# Patient Record
Sex: Male | Born: 1960 | Race: White | Hispanic: No | Marital: Married | Smoking: Never smoker
Health system: Southern US, Community
[De-identification: ages and names within clinical notes are randomized; demographics above are authoritative.]

## PROBLEM LIST (undated history)

## (undated) DIAGNOSIS — K219 Gastro-esophageal reflux disease without esophagitis: Secondary | ICD-10-CM

## (undated) DIAGNOSIS — E785 Hyperlipidemia, unspecified: Secondary | ICD-10-CM

## (undated) DIAGNOSIS — T7840XA Allergy, unspecified, initial encounter: Secondary | ICD-10-CM

## (undated) DIAGNOSIS — J45909 Unspecified asthma, uncomplicated: Secondary | ICD-10-CM

## (undated) DIAGNOSIS — B019 Varicella without complication: Secondary | ICD-10-CM

## (undated) HISTORY — DX: Gastro-esophageal reflux disease without esophagitis: K21.9

## (undated) HISTORY — PX: BRONCHOSCOPY: SUR163

## (undated) HISTORY — DX: Unspecified asthma, uncomplicated: J45.909

## (undated) HISTORY — DX: Hyperlipidemia, unspecified: E78.5

## (undated) HISTORY — DX: Varicella without complication: B01.9

## (undated) HISTORY — DX: Allergy, unspecified, initial encounter: T78.40XA

---

## 2004-12-05 ENCOUNTER — Encounter: Admission: RE | Admit: 2004-12-05 | Discharge: 2005-01-14 | Payer: Self-pay | Admitting: Nurse Practitioner

## 2005-10-02 ENCOUNTER — Emergency Department (HOSPITAL_COMMUNITY): Admission: EM | Admit: 2005-10-02 | Discharge: 2005-10-02 | Payer: Self-pay | Admitting: Emergency Medicine

## 2006-09-10 ENCOUNTER — Inpatient Hospital Stay (HOSPITAL_BASED_OUTPATIENT_CLINIC_OR_DEPARTMENT_OTHER): Admission: RE | Admit: 2006-09-10 | Discharge: 2006-09-10 | Payer: Self-pay | Admitting: Cardiology

## 2007-09-13 ENCOUNTER — Emergency Department (HOSPITAL_BASED_OUTPATIENT_CLINIC_OR_DEPARTMENT_OTHER): Admission: EM | Admit: 2007-09-13 | Discharge: 2007-09-13 | Payer: Self-pay | Admitting: Emergency Medicine

## 2007-09-30 ENCOUNTER — Ambulatory Visit: Payer: Self-pay | Admitting: Emergency Medicine

## 2007-09-30 ENCOUNTER — Emergency Department (HOSPITAL_BASED_OUTPATIENT_CLINIC_OR_DEPARTMENT_OTHER): Admission: EM | Admit: 2007-09-30 | Discharge: 2007-09-30 | Payer: Self-pay | Admitting: Emergency Medicine

## 2007-09-30 ENCOUNTER — Other Ambulatory Visit: Payer: Self-pay | Admitting: Emergency Medicine

## 2007-09-30 ENCOUNTER — Observation Stay (HOSPITAL_COMMUNITY): Admission: AD | Admit: 2007-09-30 | Discharge: 2007-10-01 | Payer: Self-pay | Admitting: Internal Medicine

## 2007-10-01 ENCOUNTER — Encounter: Payer: Self-pay | Admitting: Internal Medicine

## 2007-10-02 ENCOUNTER — Ambulatory Visit: Payer: Self-pay | Admitting: Internal Medicine

## 2007-10-02 DIAGNOSIS — E785 Hyperlipidemia, unspecified: Secondary | ICD-10-CM

## 2007-10-02 DIAGNOSIS — J45909 Unspecified asthma, uncomplicated: Secondary | ICD-10-CM | POA: Insufficient documentation

## 2007-10-02 DIAGNOSIS — J984 Other disorders of lung: Secondary | ICD-10-CM | POA: Insufficient documentation

## 2007-10-03 ENCOUNTER — Telehealth (INDEPENDENT_AMBULATORY_CARE_PROVIDER_SITE_OTHER): Payer: Self-pay | Admitting: *Deleted

## 2007-10-06 ENCOUNTER — Encounter: Payer: Self-pay | Admitting: Internal Medicine

## 2007-10-06 ENCOUNTER — Ambulatory Visit: Payer: Self-pay | Admitting: Internal Medicine

## 2007-10-06 ENCOUNTER — Ambulatory Visit: Admission: RE | Admit: 2007-10-06 | Discharge: 2007-10-06 | Payer: Self-pay | Admitting: Internal Medicine

## 2007-10-07 ENCOUNTER — Telehealth (INDEPENDENT_AMBULATORY_CARE_PROVIDER_SITE_OTHER): Payer: Self-pay | Admitting: *Deleted

## 2007-10-07 ENCOUNTER — Telehealth: Payer: Self-pay | Admitting: Pulmonary Disease

## 2007-10-07 ENCOUNTER — Telehealth: Payer: Self-pay | Admitting: Critical Care Medicine

## 2007-10-07 ENCOUNTER — Encounter: Payer: Self-pay | Admitting: Critical Care Medicine

## 2007-10-08 ENCOUNTER — Telehealth: Payer: Self-pay | Admitting: Internal Medicine

## 2007-10-08 DIAGNOSIS — J383 Other diseases of vocal cords: Secondary | ICD-10-CM | POA: Insufficient documentation

## 2007-10-13 ENCOUNTER — Emergency Department (HOSPITAL_BASED_OUTPATIENT_CLINIC_OR_DEPARTMENT_OTHER): Admission: EM | Admit: 2007-10-13 | Discharge: 2007-10-13 | Payer: Self-pay | Admitting: Emergency Medicine

## 2007-10-22 ENCOUNTER — Ambulatory Visit: Payer: Self-pay | Admitting: Internal Medicine

## 2007-10-22 DIAGNOSIS — J329 Chronic sinusitis, unspecified: Secondary | ICD-10-CM | POA: Insufficient documentation

## 2007-10-25 ENCOUNTER — Telehealth: Payer: Self-pay | Admitting: Internal Medicine

## 2007-10-28 ENCOUNTER — Encounter: Payer: Self-pay | Admitting: Internal Medicine

## 2007-10-28 ENCOUNTER — Ambulatory Visit (HOSPITAL_COMMUNITY): Admission: RE | Admit: 2007-10-28 | Discharge: 2007-10-28 | Payer: Self-pay | Admitting: Internal Medicine

## 2007-10-30 ENCOUNTER — Ambulatory Visit: Payer: Self-pay | Admitting: Internal Medicine

## 2007-10-30 ENCOUNTER — Encounter: Admission: RE | Admit: 2007-10-30 | Discharge: 2007-11-27 | Payer: Self-pay | Admitting: Internal Medicine

## 2007-11-13 ENCOUNTER — Encounter: Payer: Self-pay | Admitting: Pulmonary Disease

## 2007-11-13 ENCOUNTER — Encounter: Payer: Self-pay | Admitting: Internal Medicine

## 2007-11-13 ENCOUNTER — Ambulatory Visit (HOSPITAL_BASED_OUTPATIENT_CLINIC_OR_DEPARTMENT_OTHER): Admission: RE | Admit: 2007-11-13 | Discharge: 2007-11-13 | Payer: Self-pay | Admitting: Internal Medicine

## 2007-11-20 ENCOUNTER — Ambulatory Visit: Payer: Self-pay | Admitting: Pulmonary Disease

## 2007-11-21 ENCOUNTER — Ambulatory Visit: Payer: Self-pay | Admitting: Internal Medicine

## 2007-11-27 ENCOUNTER — Encounter: Payer: Self-pay | Admitting: Internal Medicine

## 2007-11-28 ENCOUNTER — Ambulatory Visit: Payer: Self-pay | Admitting: Pulmonary Disease

## 2007-11-28 DIAGNOSIS — G4733 Obstructive sleep apnea (adult) (pediatric): Secondary | ICD-10-CM | POA: Insufficient documentation

## 2008-01-26 ENCOUNTER — Encounter: Payer: Self-pay | Admitting: Pulmonary Disease

## 2008-01-28 ENCOUNTER — Telehealth (INDEPENDENT_AMBULATORY_CARE_PROVIDER_SITE_OTHER): Payer: Self-pay | Admitting: *Deleted

## 2008-01-30 ENCOUNTER — Telehealth (INDEPENDENT_AMBULATORY_CARE_PROVIDER_SITE_OTHER): Payer: Self-pay | Admitting: *Deleted

## 2008-02-05 ENCOUNTER — Encounter: Payer: Self-pay | Admitting: Pulmonary Disease

## 2008-02-18 ENCOUNTER — Encounter: Payer: Self-pay | Admitting: Pulmonary Disease

## 2008-03-08 ENCOUNTER — Ambulatory Visit: Payer: Self-pay | Admitting: Pulmonary Disease

## 2008-03-12 ENCOUNTER — Encounter: Payer: Self-pay | Admitting: Pulmonary Disease

## 2008-06-01 ENCOUNTER — Ambulatory Visit (HOSPITAL_COMMUNITY): Admission: RE | Admit: 2008-06-01 | Discharge: 2008-06-01 | Payer: Self-pay | Admitting: Family Medicine

## 2010-02-05 ENCOUNTER — Encounter: Payer: Self-pay | Admitting: Internal Medicine

## 2010-05-30 NOTE — Op Note (Signed)
Justin Richard, Justin Richard               ACCOUNT NO.:  [id number]   MEDICAL RECORD NO.:  [id number]          PATIENT TYPE:  AMB   LOCATION:  CARD                         FACILITY:  [facility]   PHYSICIAN:  Kalman Shan, MD   DATE OF BIRTH:  May 31, 1960   DATE OF PROCEDURE:  10/06/2007  DATE OF DISCHARGE:                               OPERATIVE REPORT   TYPE OF PROCEDURE:  Bronchoscopy with airway exam, rule out  endobronchial lesion.   INDICATION:  Respiratory symptoms of shortness of breath and wheezing  with focal wheezing, normal pulmonary function testing.   PREPROCEDURE ASSESSMENT:  The history and physical was less than 30 days  old.  I had seen him in the office on October 02, 2007.  The interim  history is that this morning just prior to bronchoscopy.  He did have  some mild streaky hemoptysis that is new.   PREPROCEDURE VITAL SIGNS:  Height 73 inches, weight 250 pounds, blood  pressure 134/87, pulse of 62, respiratory rate of 13.  ASA class I.   AIRWAY ASSESSMENT:  Mallampati class III.   Physical exam was within normal limits other than some obesity.   The patient was considered acceptable for IV moderate sedation and could  undergo procedure.   PREPROCEDURE SEDATION PLAN:  Topical anesthesia with lidocaine, maximum  dose being 904 mg, an intermittent doses of fentanyl and Versed for  sedation with a goal sedation of RASS -1 to -2.   PROCEDURE:  After adequate premedication, the scope was introduced  through the left naris at 12:23.  The vocal cords were visualized and in  the pharyngeal area just anterior to the epiglottis and supraglottic  area there was plenty of white plaque-like lesions consistent with  thrush.  A photograph was taken.  Subsequently the vocal cord movements  were visualized in close detail.  It appeared that they were moving  normally with inspiration and expiration. However, when instructed to  take a deep breath: at the height of inspiration,  there was vocal cord  adduction multiple times.  This was consistent with vocal cord  dysfunction.   Subsequently, after adequate topical anesthesia of the vocal cords, the  bronchoscope was negotiated through the trachea.  In the mid tracheal  region in the anterior 1 o'clock position a couple of white plaque-like  lesions were seen consistent with thrush. Photographs of this was taken  as well.  Subsequently a detailed airway exam on the right side was  first performed.  The right bronchus intermedius appeared hyperdynamic  and extrinsically collapsible.  No endobronchial lesions were seen.  White mucus plug was seen deep in the right lower lobe posterior  segment.  This was aspirated without any problems.  Careful exam in the  different subsegments of the different right sided lobes did not reveal  in any endobronchial lesion.   The scope was then withdrawn and attention turned to the left side.  In  the carina and the left main bronchus medially there was white plaque-  like lesions and in addition what looked like a mucus plug.  This was  aspirated easily.  However, following the aspiration there was easy  bruising of the left main bronchus with clouding of the visual field by  blood.  At this time, the patient began to have a lot of cough.  Subsequently more sedation was given.  He then had transient  desaturation to 77% pulse ox.  A bite block was placed and the FIO2 was  increased.  With this, the desaturations settled.  After more topical  lidocaine further visualization of the left-sided airways was done.  Again was extrinsic compression and hyperdynamic compression of the left  main bronchus.  Distal to this area easy bruising in the left main  bronchus.  The rest of the subsegments and all the lobes looked normal.  No endobronchial lesions were seen and there is no evidence of any  abrasions of the trachea anywhere.   1. Folllowing this biopsies were taken of the secondary  carina leading      into the Right Upper Lobe. Some more biopises were taken from the      left main bronchus in the subcarinal regions in the area of where      the mucus plug was.  These were labeled together as biopsies from      the bronchus intermedius and left main bronchus.   Next set of biopsies were taken from the trachea but however, the biopsy  samples these were not good.  The intention was to biopsy the area of  the white plaque. Therefore, a bronchial brush was introduced and the  white lesions in the trachea at 1 o'clock position and the abraded part  of the left main bronchus was brushed and this was collectively labeled  and is being sent for microbiology.  Next a general collection of  bronchial washing which included all the original mucus plugs was  collected and is being sent for cytology, Gram stain and microbiology.   The scope was first introduced at 12:23, withdrawn at 12:43 for cleaning  and due to transient desaturations, reintroduced at 12:46 and then  finally withdrawn at 1304 hours.   POSTPROCEDURE IMPRESSION:  1. Supraglottic thrush.  2. Tracheal thrush  3. Possible thrush and mucus plugging in the left main bronchus.  4. Post aspiration of the left main bronchus secretions abrasion.  5. No endobronchial lesions seen.  6. Biopsies and brushes and washing collected.  7. Vocal cord dysfunction.   POSTPROCEDURE PLAN:  1. He will recover in the recovery room.  2. Two weeks of fluconazole will be prescribed.  3. Follow-up in the office to see how his symptoms improve with      fluconazole.  4. If symptoms still persist, will do bronchoscopy again.  He might      also need a surveillance bronchoscopy.   I have updated his wife about the findings and the plan.  A photograph  has been given to him and his wife.      Kalman Shan, MD  Electronically Signed     MR/MEDQ  D:  10/06/2007  T:  10/07/2007  Job:  [id number]

## 2010-05-30 NOTE — Cardiovascular Report (Signed)
NAME:  Justin Richard, Justin Richard NO.:  [id number]   MEDICAL RECORD NO.:  [id number]          PATIENT TYPE:  OIB   LOCATION:  NA                           FACILITY:  [facility]   PHYSICIAN:  Peter M. Swaziland, M.D.  DATE OF BIRTH:  [birth year]   DATE OF PROCEDURE:  09/10/2006  DATE OF DISCHARGE:                            CARDIAC CATHETERIZATION   INDICATION FOR PROCEDURE:  This 50 year old white male was recently  hospitalized overnight for evaluation of chest pain.  He ruled out for  myocardial infarction.  However, on subsequent stress Cardiolite study,  there was noted to be a moderate reversible defect in the anterolateral  wall.  The patient has been asymptomatic since then.  He does have a  history of dyslipidemia.  He also has a family history of coronary  disease.   PROCEDURES:  1. Left heart catheterization.  2. Coronary and left ventricular angiography.   ACCESS:  Is via the right femoral artery using the standard Seldinger  technique.   EQUIPMENT:  4-French 4 cm left Judkins catheter, 4-French 3DRC catheter,  4-French pigtail catheter, 4-French arterial sheath.   CONTRAST:  85 mL of Omnipaque.   MEDICATIONS:  Local anesthesia with 1% Xylocaine.   HEMODYNAMIC DATA:  Aortic pressure was 119/75 with a mean of 95 mmHg.  Left ventricle pressure was 114 with EDP of 14 mmHg.   ANGIOGRAPHIC DATA:  The left coronary arises and distributes normally.  The left main coronary artery is normal.   The left anterior descending is a large vessel and is normal throughout  its course.  There is a moderate-size first diagonal branch which is  also normal.   The left circumflex coronary gives rise to three marginal vessels.  This  vessel was normal throughout its course.   The right coronary is a dominant vessel.  It arises normally.  It is  also normal.   Left ventricular angiography was performed in the RAO view.  This  demonstrates normal left ventricular size and  contractility with normal  systolic function.  Ejection fraction is estimated 60%.  There is no  significant mitral regurgitation or prolapse.   FINAL INTERPRETATION:  1. Normal coronary anatomy.  2. Normal left ventricular function.           ______________________________  Peter M. Swaziland, M.D.     PMJ/MEDQ  D:  09/10/2006  T:  09/10/2006  Job:  [id number]   cc:   Molly Maduro L. Foy Guadalajara, M.D.

## 2010-05-30 NOTE — Discharge Summary (Signed)
NAME:  Justin Richard, Justin Richard NO.:  [id number]   MEDICAL RECORD NO.:  [id number]          PATIENT TYPE:  OBV   LOCATION:  5037                         FACILITY:  [facility]   PHYSICIAN:  Ramiro Harvest, MD    DATE OF BIRTH:  February 12, 1960   DATE OF ADMISSION:  09/30/2007  DATE OF DISCHARGE:  10/01/2007                               DISCHARGE SUMMARY   PRIMARY CARE PHYSICIAN:  Robert L. Foy Guadalajara, M.D. of [facility].   DISCHARGE DIAGNOSES:  1. Mild asthma in the setting of upper respiratory infection and      gastroesophageal reflux disease.  2. Hyperlipidemia.   DISCHARGE MEDICATIONS:  1. Omeprazole 20 mg p.o. b.i.d.  2. Albuterol MDI 2 puffs every 4 hours p.r.n.   DISPOSITION AND FOLLOWUP:  The patient will be discharged home.  The  patient is to follow up with Dr. Marchelle Gearing as scheduled, and also to  follow up on his pulmonary function tests.  The patient is also to follow up with his primary care physician, Dr.  Foy Guadalajara, in 2 weeks.  The patient will need a followup CT on nodules in  the right middle and left upper lobes in 6-12 months.   PROCEDURE CONSULTATIONS DONE:  A pulmonary consult was done.  The  patient was seen in consultation by Dr. Delton Coombes of [facility] on  September 30, 2007.   PROCEDURES PERFORMED:  1. A pulmonary function test was performed on October 01, 2007.  2. A chest x-ray was performed on September 30, 2007, which showed      normal exam.  3. A CT of the chest without contrast was performed on September 30, 2007, which showed small nodules in the right middle and left upper      lobes.  Recommend followup CT in 6-12 months.  No active pulmonary      infiltrates, no interstitial or airspace disease.   BRIEF ADMISSION HISTORY AND PHYSICAL:  Justin Richard is a 50-year-  old gentleman,who for the past 6 weeks prior to admission,had had  problems with wheezing, which were progressively getting worse with some  shortness of breath.   The patient had been evaluated and followed by his  PCP with attempted inhalers.  The patient stated he tried his steroid  Dosepak, and he states that his symptoms have gotten worse.  The patient  had also been treated for reflux even though he did not have any other  reflux symptoms, but states again his symptoms continued to worsen.  The  patient was scheduled to see [facility], but then  started to have shortness of breath and wheezing.  In the early morning  hours of September 30, 2007, the patient went to the Medical Center at  [facility], at which time he was evaluated, given some neb treatments  and a dose of steroids and sent home after which he was feeling better.  However,  he returned 4 hours later complained of increased wheezing and  shortness of breath and was admitted.  At the time it was felt because  he had gotten worse, he needed to be admitted for further evaluation and  treatment.  The patient was given nebulizer treatment in the emergency  room, a dose of steroids and a transfer to [facility].  At the  time of admission the patient was feeling a little bit better.  The  patient denied any headaches, no visual changes, no dysphagia.  No  current chest pain, palpitations, no complaints of shortness of breath  at rest.  He complained of some mild wheezing, which improved after  breathing treatments.  No cough, no abdominal pain, no hematuria, no  dysuria, no constipation or diarrhea.  No focal extremity weakness,  numbness or pain.   REVIEW OF SYSTEMS:  Otherwise negative.   PHYSICAL EXAM:  Per admitting physician:  Temperature 98.6, pulse 70,  blood pressure 154/82, respirations 18, satting 94% on 2 liters nasal  cannula.  GENERAL:  The patient was alert and oriented x3, in no apparent  distress.  HEENT: Normocephalic, atraumatic.  Moist mucous membranes.  No carotid  bruits.  CARDIOVASCULAR:  Regular rate and rhythm.  S1, S2.  RESPIRATORY:   Upper apices were clear to auscultation bilaterally.  No  bases with some bilateral expiratory wheezing.  ABDOMEN:  Soft, nontender, nondistended.  Positive bowel sounds.  EXTREMITIES:  No clubbing, cyanosis or edema.   ADMISSION LABORATORIES:  Sodium 139, potassium 4.8, chloride 105, bicarb  22, BUN 29, creatinine 0.9, glucose of 118.  CBC with a white count of  10.6, no shift.  H and H 15 and 44, MCV of 87, platelet count of 244.  Chest x-ray at [facility] was completely normal.   HOSPITAL COURSE:  Mild asthma in the setting of gastroesophageal reflux  disease.  The patient was admitted with recurrent wheezing and shortness  of breath, which had been progressively getting worse over the past 6  weeks with the question of asthma.  The patient had no evidence of  chronic obstructive pulmonary disease, no history of smoking.  The  patient was placed on a nebulizer treatments as well as Protonix daily.  CT of his chest was obtained to rule out restrictive disease with  results as stated above.  An ABG was obtained during admission, which  came back with a pH of 7.42, pCO2 of 35, pO2 of 74, bicarb of 22, O2  saturation  of  95.  The patient was maintained on nebulizer treatments  throughout the hospitalization.  Pulmonary was consulted.  The patient  was seen in consultation by Dr. Delton Coombes of [facility].  Pulmonary  function studies were obtained and it was felt that the patient had a  mild component of asthma which improved with bronchodilator therapy.  It  was felt that the patient's exacerbation was exacerbated by am upper  respiratory tract infection.  The patient improved symptomatically  throughout the hospitalization and by day of discharge, the patient was  in stable and improved condition.  The patient was to continue on  omeprazole 20 mg b.i.d. as well as well as albuterol inhaler as needed,  and to follow up with Dr. Marchelle Gearing as scheduled for further  evaluation  and management.   Vital signs on day of discharge:  Temperature 97.8, pulse of 80, blood  pressure 142/74, respiratory rate 18, satting 98% on room air.  The rest  of the patient's chronic medical issues were stable throughout the  hospitalization and the patient will be discharged in stable and  improved  condition.     It was a pleasure taking care of Mr. Joash Tony.      Ramiro Harvest, MD  Electronically Signed     DT/MEDQ  D:  11/04/2007  T:  11/04/2007  Job:  [id number]   cc:   Molly Maduro L. Foy Guadalajara, M.D.  Kalman Shan, MD

## 2010-05-30 NOTE — Procedures (Signed)
NAME:  Justin Richard, Justin Richard NO.:  [id number]   MEDICAL RECORD NO.:  [id number]          PATIENT TYPE:  OUT   LOCATION:  SLEEP CENTER                 FACILITY[facility]  [facility]   PHYSICIAN:  Coralyn Helling, MD        DATE OF BIRTH:  03-Jan-1961   DATE OF STUDY:  11/13/2007                            NOCTURNAL POLYSOMNOGRAM   REFERRING PHYSICIAN:  Kalman Shan, MD   INDICATION FOR STUDY:  [name] is a 50 year old male who has a  history of snoring, sleep disruption, excessive daytime sleepiness, and  obesity.  He is referred to the sleep lab for evaluation of hypersomnia  with obstructive sleep apnea.   Height is 6 feet 1 inch.  Weight is 246 pounds.  BMI is 32.  Neck size  is 18.   EPWORTH SLEEPINESS SCORE:  Epworth score is 4.   MEDICATIONS:  TriCor, Singulair, QVAR , Foradil, Ventolin, Xopenex, and  omeprazole.   SLEEP ARCHITECTURE:  Total recording time was 410 minutes.  Total sleep  time was 323 minutes.  Sleep efficiency was 79%.  Sleep latency is 2.5  minutes.  REM latency is 199 minutes.  The study was notable for  reduction of the percentage of REM sleep.  The patient slept in both the  supine and nonsupine position.   RESPIRATORY DATA:  The average respiratory rate was 14.  Loud snoring  was noted by the technician throughout the study.  The overall apnea-  hypopnea index was 43.  The events were exclusively obstructive in  nature.  The REM apnea-hypopnea index was 32.  The non-REM apnea-  hypopnea index was 44.  The supine apnea-hypopnea index was 66.  The  nonsupine apnea-hypopnea index was 28.   OXYGEN DATA:  The baseline oxygenation was 98%.  The oxygen saturation  nadir was 82%.  The patient spent a total of 1.6 minutes with an oxygen  saturation less than 90% and 0.6 minutes with an oxygen saturation less  than 88%.   CARDIAC DATA:  The average heart rate was 63 and the rhythm strip showed  normal sinus rhythm.   MOVEMENT-PARASOMNIA:  The periodic  limb movement index was 0.  The  patient had 2 restroom trips.   IMPRESSION-RECOMMENDATIONS:  This study shows evidence for severe  obstructive sleep apnea with an apnea-hypopnea index of 43 and an oxygen  saturation nadir of 82%.  In addition to diet, exercise, and weight  reduction, the patient should be referred back to the Sleep Lab for a  CPAP titration study.      Coralyn Helling, MD  Diplomat, American Board of Sleep Medicine  Electronically Signed     VS/MEDQ  D:  11/20/2007 08:11:47  T:  11/20/2007 22:09:53  Job:  [id number]   cc:   Kalman Shan, MD  [address] [place] [address]  [place] [address]

## 2010-05-30 NOTE — H&P (Signed)
NAME:  ELMOR, KOST NO.:  [id number]   MEDICAL RECORD NO.:  [id number]           PATIENT TYPE:   LOCATION:                                 FACILITY:   PHYSICIAN:  Peter M. Swaziland, M.D.  DATE OF BIRTH:  1960-11-09   DATE OF PROCEDURE:  DATE OF DISCHARGE:                    STAT - MUST CHANGE TO CORRECT WORK TYPE   HISTORY OF PRESENT ILLNESS:  Mr. Mangan is a 50 year old white male  with history of hyperlipidemia and obesity.  He presented recently with  symptoms of substernal chest pain while on vacation at [facility].  He  described these symptoms as a pressure sensation in his mid chest with a  sensation of expanding like a balloon. His pain became fairly intense up  to 8/10. It lasts approximately 10 minutes and then seems to abate.  He  did present to the emergency department there and ruled out for  myocardial infarction with enzymes and ECG.  The following morning he  underwent a stress Cardiolite study.  He was able to exercise 9 minutes  on the Bruce protocol without any symptoms or chest pain.  He had no ECG  changes.  He had an adequate heart rate response.  Subsequent Cardiolite  images, however,  demonstrated a moderate defect involving the high  lateral wall that was worse on the stress images.  His ejection fraction  was 61%.  Based on these findings, further evaluation has been  recommended.  The patient has noted over several-month period symptoms  of increased dyspnea that may occur both with and without exertion.   PAST MEDICAL HISTORY:  1. Significant for dyslipidemia.  2. He has a history of reflux esophagitis.  3. History of obesity.  4. He has no history of hypertension or diabetes.  5. He has intolerance to codeine which causes nausea.   CURRENT MEDICATIONS:  1. Tricor 145 mg pre day.  2. Aspirin 81 mg per day.  3. Fish oil supplement 2000 mg per day.   SOCIAL HISTORY:  The patient works as a Product/process development scientist.  He is  married.   He has two children in good health.  He denies tobacco or  alcohol use.   FAMILY HISTORY:  Father died at age 30.  He had coronary disease and had  had two prior bypass surgeries. He had also had a stroke.  Mother died  at age 59 with Hodgkin's Disease.  One brother has some type of heart  disease.  He has a total of five siblings who are generally in good  health.  There is a family history of diabetes.   REVIEW OF SYSTEMS:  Otherwise unremarkable.   PHYSICAL EXAMINATION:   REVIEW OF SYSTEMS:  The patient denies any increased, edema, orthopnea.  She has had no tachy palpitations.  She denies any true chest pain.  No  history of CDA.  Other review of systems are negative.   PHYSICAL EXAMINATION:  GENERAL:  The patient is an obese white male in  no apparent distress.  VITAL SIGNS:  Weight 240, blood pressure 140/90 using the large cuff.  Pulse 72  and regular. Respirations are normal.  HEENT:  Normocephalic, atraumatic.  Pupils are equal, round and reactive  to light and accommodation.  Conjunctivae clear.  Oropharynx clear.  NECK:  Supple without JVD, adenopathy, thyromegaly, or bruits.  LUNGS:  Clear to auscultation and percussion.  CARDIAC:  Regular rate and rhythm without gallop, murmur or  click.  ABDOMEN:  Soft, nontender.  He has no masses or hepatosplenomegaly.  EXTREMITIES:  Pedal pulses are 2+ and symmetric.  No edema.  NEUROLOGIC:  Nonfocal.   LABORATORY DATA:  His ECG shows normal sinus rhythm; normal ECG.  Chest  x-ray showed no active disease.   IMPRESSION:  1. Symptoms of atypical chest pain in a patient with risk factors,      positive family history and history of dyslipidemia.  Recent stress      Cardiolite study is suggestive of lateral wall ischemia.  2. Dyslipidemia.  3. Obesity.  4. Family history of coronary artery disease.  5. Acid reflux disease.   PLAN:  Will proceed with a diagnostic cardiac catheterization with  further therapy pending these  results.           ______________________________  Peter M. Swaziland, M.D.     PMJ/MEDQ  D:  09/04/2006  T:  09/04/2006  Job:  [id number]   cc:   Molly Maduro L. Foy Guadalajara, M.D.

## 2010-05-30 NOTE — H&P (Signed)
Justin Richard, Justin Richard NO.:  [id number]   MEDICAL RECORD NO.:  [id number]          PATIENT TYPE:  INP   LOCATION:  3707                         FACILITY:  [facility]   PHYSICIAN:  Hollice Espy, M.D.DATE OF BIRTH:  [birth year]   DATE OF ADMISSION:  09/30/2007  DATE OF DISCHARGE:                              HISTORY & PHYSICAL   PATIENT'S PCP:  Dr.  Eulis Foster.   CONSULTANTS ON THIS CASE:  [name].   CHIEF COMPLAINT:  Shortness of breath and wheezing.   HISTORY OF PRESENT ILLNESS:  The patient is a 50 year old white male who  for the past 6 weeks has had problems with wheezing, progressively  worsening and some shortness of breath.  He had been evaluated and has  been followed by his PCP and has attempted inhalers.  He tried a steroid  dose pack and if anything he says symptoms have gotten worse.  He has  also has been treated for reflux even though he has not had any other  reflex symptoms, but he says, again, that his symptoms continued to  worsen.  He was scheduled to see [name], but then  starting having such shortness of breath and wheezing.  In the early  morning hours of September 30, 2007 he went to [facility].  At that time, he was evaluated, given some nebulizer treatment and a  dose of steroids and sent home after he was feeling better.  However, he  returned 4 hours later, complaining of increased  wheezing and shortness  of breath and came in.  At that time it was felt because he had gotten  worse he needed to be admitted for further evaluation and treatment.  The patient was given nebulizer treatment in the emergency room and a  dose of steroids and then transferred over to [facility].  Currently, he  is feeling a little bit better.   He denied any headaches, vision changes, dysphagia, no current chest  pain.  No palpitations.  He complains of no shortness of breath when he  is not moving.  He  complains of some mild wheezing and better after his  breathing treatment.  No cough, no abdominal pain.  No hematuria,  dysuria, constipation, diarrhea.  No focal extremity numbness, weakness  or pain.  Review of systems otherwise negative.   PAST MEDICAL HISTORY:  1. History of hyperlipidemia.  2. He has had a questionable history of esophageal spasm.  He      underwent a cardiac catheterization 2008 to evaluate for this and      at that time it was found to be completely normal.  3. He has a questionable history of asthma as to what could be causing      this wheezing.   MEDICATIONS:  Currently, the patient is on ProAir inhaler, Tricor and  albuterol.   ALLERGIES:  CODEINE.   SOCIAL HISTORY:  No tobacco ever.  He said maybe he has smoked about  five cigarettes his entire life.  No heavy alcohol, no drug use.   FAMILY  HISTORY:  Noncontributory.   ENVIRONMENTAL HISTORY:  The patient says that he has worked in a number  of different jobs and has been exposed to different things such as hay,  pollens and dust and silica, but has never had any problems from this.   PHYSICAL EXAMINATION:  The patient's vitals after arrival to [facility],  temperature 98.6, heart rate 70, blood pressure 154/82, respirations 18,  O2 SAT 94% on 2 L.  IN GENERAL:  He is alert and oriented x3 in no apparent distress.  HEENT:  Normocephalic and atraumatic.  Mucous membranes are moist.  He has no carotid bruits.  HEART:  Regular rate and rhythm, S1-2.  LUNGS:  Upper apices are clear to auscultation bilaterally.  The lower  bases have some bilateral expiratory wheezing.  ABDOMEN:  Soft, nontender and nondistended.  Positive bowel sounds.  No clubbing, cyanosis or edema.   LABORATORY DATA:  Sodium 139, potassium 4.8, chloride 105, bicarb 22,  BUN 29, creatinine 0.9, glucose 118.  His white count shows 10.6, but no  shift , H and H 15 and 44, MCV 87, platelet count 244.  A chest x-ray  done at [facility] notes completely normal exam finding.   ASSESSMENT/PLAN:  1. Wheezing, recurrent with shortness of breath, progressive worsening      over the last 6 weeks, questionable asthma.  No evidence of chronic      obstructive pulmonary disease.  No history of smoking, although he      has been exposed to a number of elements including hay dust and      silica. We will check a CT for restrictive disease.  Check an      arterial blood gas on room air and ask pulmonary to see.  2. History of hyperlipidemia.      Hollice Espy, M.D.  Electronically Signed     SKK/MEDQ  D:  09/30/2007  T:  09/30/2007  Job:  [id number]   cc:   Kalman Shan, MD

## 2010-10-16 LAB — BLOOD GAS, ARTERIAL
Drawn by: 295031
FIO2: 0.21
O2 Saturation: 95
pCO2 arterial: 34.7 — ABNORMAL LOW
pO2, Arterial: 73.7 — ABNORMAL LOW

## 2010-10-16 LAB — LEGIONELLA PROFILE(CULTURE+DFA/SMEAR): Legionella Antigen (DFA): NEGATIVE

## 2010-10-16 LAB — FUNGUS CULTURE W SMEAR: Fungal Smear: NONE SEEN

## 2010-10-16 LAB — P CARINII SMEAR DFA

## 2010-10-16 LAB — DIFFERENTIAL
Basophils Absolute: 0.1
Eosinophils Absolute: 0.6
Lymphs Abs: 2.2
Neutro Abs: 7

## 2010-10-16 LAB — AFB CULTURE WITH SMEAR (NOT AT ARMC): Acid Fast Smear: NONE SEEN

## 2010-10-16 LAB — BASIC METABOLIC PANEL
Calcium: 9
GFR calc non Af Amer: >60
Glucose, Bld: 118 — ABNORMAL HIGH
Sodium: 139

## 2010-10-16 LAB — CBC
Hemoglobin: 15
Platelets: 244
RDW: 13.3

## 2010-10-16 LAB — CULTURE, RESPIRATORY W GRAM STAIN: Culture: NO GROWTH

## 2011-09-24 ENCOUNTER — Other Ambulatory Visit: Payer: Self-pay | Admitting: Gastroenterology

## 2014-10-14 ENCOUNTER — Telehealth: Payer: Self-pay | Admitting: Behavioral Health

## 2014-10-14 NOTE — Telephone Encounter (Signed)
Patient rescheduled for 10/22/14 at 8:30 AM.

## 2014-10-15 ENCOUNTER — Ambulatory Visit: Payer: Self-pay | Admitting: Medical

## 2014-10-20 ENCOUNTER — Telehealth: Payer: Self-pay

## 2014-10-20 NOTE — Telephone Encounter (Signed)
Pre visit call completed 

## 2014-10-22 ENCOUNTER — Ambulatory Visit (INDEPENDENT_AMBULATORY_CARE_PROVIDER_SITE_OTHER): Payer: 59 | Admitting: Medical

## 2014-10-22 ENCOUNTER — Encounter: Payer: Self-pay | Admitting: Medical

## 2014-10-22 ENCOUNTER — Ambulatory Visit (HOSPITAL_BASED_OUTPATIENT_CLINIC_OR_DEPARTMENT_OTHER)
Admission: RE | Admit: 2014-10-22 | Discharge: 2014-10-22 | Disposition: A | Discharge status: Home / Self Care | Payer: 59 | Source: Ambulatory Visit | Attending: Medical | Admitting: Medical

## 2014-10-22 VITALS — BP 126/80 | HR 83 | Temp 98.0°F | Ht 73.75 in | Wt 268.0 lb

## 2014-10-22 DIAGNOSIS — R739 Hyperglycemia, unspecified: Secondary | ICD-10-CM

## 2014-10-22 DIAGNOSIS — Z23 Encounter for immunization: Secondary | ICD-10-CM | POA: Diagnosis not present

## 2014-10-22 DIAGNOSIS — R911 Solitary pulmonary nodule: Secondary | ICD-10-CM | POA: Insufficient documentation

## 2014-10-22 DIAGNOSIS — K219 Gastro-esophageal reflux disease without esophagitis: Secondary | ICD-10-CM | POA: Insufficient documentation

## 2014-10-22 DIAGNOSIS — G4730 Sleep apnea, unspecified: Secondary | ICD-10-CM

## 2014-10-22 DIAGNOSIS — E785 Hyperlipidemia, unspecified: Secondary | ICD-10-CM

## 2014-10-22 DIAGNOSIS — K760 Fatty (change of) liver, not elsewhere classified: Secondary | ICD-10-CM | POA: Diagnosis not present

## 2014-10-22 DIAGNOSIS — Z860100 Personal history of colon polyps, unspecified: Secondary | ICD-10-CM

## 2014-10-22 DIAGNOSIS — R7309 Other abnormal glucose: Secondary | ICD-10-CM

## 2014-10-22 DIAGNOSIS — J309 Allergic rhinitis, unspecified: Secondary | ICD-10-CM

## 2014-10-22 LAB — COMPREHENSIVE METABOLIC PANEL
ALT: 26 U/L (ref 0–53)
AST: 17 U/L (ref 0–37)
Albumin: 4.2 g/dL (ref 3.5–5.2)
Alkaline Phosphatase: 76 U/L (ref 39–117)
BUN: 17 mg/dL (ref 6–23)
CO2: 26 meq/L (ref 19–32)
Calcium: 9.7 mg/dL (ref 8.4–10.5)
Chloride: 99 mEq/L (ref 96–112)
Creatinine, Ser: 0.89 mg/dL (ref 0.40–1.50)
GFR: 94.22 mL/min (ref >60.00)
GLUCOSE: 121 mg/dL — AB (ref 70–99)
POTASSIUM: 4.1 meq/L (ref 3.5–5.1)
SODIUM: 134 meq/L — AB (ref 135–145)
Total Bilirubin: 0.4 mg/dL (ref 0.2–1.2)
Total Protein: 7 g/dL (ref 6.0–8.3)

## 2014-10-22 LAB — LDL CHOLESTEROL, DIRECT: LDL DIRECT: 64 mg/dL

## 2014-10-22 LAB — LIPID PANEL
CHOL/HDL RATIO: 11
Cholesterol: 337 mg/dL — ABNORMAL HIGH (ref 0–200)
HDL: 31.1 mg/dL — AB (ref >39.00)
Triglycerides: 2075 mg/dL — ABNORMAL HIGH (ref 0.0–149.0)

## 2014-10-22 NOTE — Assessment & Plan Note (Signed)
Pt had asthma like symptoms 3 months about 5 years ago. Symptoms have not been recurrent. He mentioned some reflux around that time before asthma type symptoms.

## 2014-10-22 NOTE — Assessment & Plan Note (Addendum)
Occasional seasonal. Will rx montelukast in future if needed.

## 2014-10-22 NOTE — Assessment & Plan Note (Signed)
pt states in past about 5 years ago needed prilosec brief time But now controlled only with diet. Gets reflux rare maybe twice a year

## 2014-10-22 NOTE — Progress Notes (Signed)
Subjective:    Patient ID: Justin Richard, male    DOB: [birth year], 54 y.o.   MRN: [id number]  HPI  Pt in to establish care.  Pt general Multimedia programmer. Rare exercise, occasional caffeine. 12-24 oz caffeine a day with tea(1/2 and 1/2). Fair diet. Eats fruits and vegetables. Avoids fried foods. Married- 2 children 69 yo and 36 yo.   Hyperlipidemia- dx 10 yrs ago. Pt has not been simvistatin for past month. No side effects. Pt is fasting today. Pt states when he was on medication his lipids were reasonably controlled.  GERD- pt states in past about 5 years ago needed prilosec brief time But now controlled only with diet. Gets reflux rare maybe twice a year.   Hx of pulmonary nodule- as teenager smoked very little for about 1 yr. Some second hand smoke occasionally at work. Ct done in 2010 showed the nodule. 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  Allergic rhinitis- seasonal allergies. In past was on singulair.   History of colon polyp. Found in 2000-2001.    Review of Systems  Constitutional: Negative for fever, chills, diaphoresis, activity change and fatigue.  Respiratory: Negative for cough, chest tightness and shortness of breath.   Cardiovascular: Negative for chest pain, palpitations and leg swelling.  Gastrointestinal: Negative for nausea, vomiting and abdominal pain.  Musculoskeletal: Negative for neck pain and neck stiffness.  Neurological: Negative for dizziness, tremors, seizures, syncope, facial asymmetry, speech difficulty, weakness, light-headedness, numbness and headaches.  Psychiatric/Behavioral: Negative for behavioral problems, confusion and agitation. The patient is not nervous/anxious.     Past Medical History  Diagnosis Date  . Hyperlipidemia   . Asthma   . Chicken pox   . GERD (gastroesophageal reflux disease)   . Allergy     Social History   Social History  . Marital Status: Married   Spouse Name: N/A  . Number of Children: N/A  . Years of Education: N/A   Occupational History  . Not on file.   Social History Main Topics  . Smoking status: Never Smoker   . Smokeless tobacco: Never Used  . Alcohol Use: No  . Drug Use: No  . Sexual Activity: Not on file   Other Topics Concern  . Not on file   Social History Narrative    Past Surgical History  Procedure Laterality Date  . Bronchoscopy      Family History  Problem Relation Age of Onset  . Arthritis Mother   . Cancer Mother   . Hyperlipidemia Mother   . Diabetes Mother   . Arthritis Father   . Cancer Father   . Hyperlipidemia Father   . Diabetes Father   . Cancer Maternal Grandmother   . Diabetes Maternal Grandmother   . Cancer Maternal Grandfather   . Diabetes Maternal Grandfather   . Cancer Paternal Grandmother   . Diabetes Paternal Grandmother   . Cancer Paternal Grandfather   . Diabetes Paternal Grandfather     Allergies  Allergen Reactions  . Codeine Nausea Only    No current outpatient prescriptions on file prior to visit.   No current facility-administered medications on file prior to visit.    BP 126/80 mmHg  Pulse 83  Temp(Src) 98 F (36.7 C) (Oral)  Ht 6' 1.75" (1.873 m)  Wt 268 lb (121.564 kg)  BMI 34.65 kg/m2  SpO2 99%       Objective:   Physical Exam  General Mental Status- Alert. General  Appearance- Not in acute distress.   Skin General: Color- Normal Color. Moisture- Normal Moisture.  Neck Carotid Arteries- Normal color. Moisture- Normal Moisture. No carotid bruits. No JVD.  Chest and Lung Exam Auscultation: Breath Sounds:-Normal. CTA.  Cardiovascular Auscultation:Rythm- Regular,Rate and Rhythm. Murmurs & Other Heart Sounds:Auscultation of the heart reveals- No Murmurs.  Abdomen Inspection:-Inspeection Normal.(but obese abdomen) Palpation/Percussion:Note:No mass. Palpation and Percussion of the abdomen reveal- Non Tender, Non Distended + BS, no  rebound or guarding.  Neurologic Cranial Nerve exam:- CN III-XII intact(No nystagmus), symmetric smile. Strength:- 5/5 equal and symmetric strength both upper and lower extremities.      Assessment & Plan:

## 2014-10-22 NOTE — Assessment & Plan Note (Signed)
Will put in order for ct.

## 2014-10-22 NOTE — Assessment & Plan Note (Signed)
Pt states in past he could not tolerate cpap. Last sleep study was 1 yr ago.

## 2014-10-22 NOTE — Progress Notes (Signed)
Pre visit review using our clinic review tool, if applicable. No additional management support is needed unless otherwise documented below in the visit note. 

## 2014-10-22 NOTE — Assessment & Plan Note (Signed)
Will get cmp and lipid panel today.

## 2014-10-22 NOTE — Patient Instructions (Addendum)
OBSTRUCTIVE SLEEP APNEA Pt states in past he could not tolerate cpap. Last sleep study was 1 yr ago.   ASTHMA Pt had asthma like symptoms 3 months about 5 years ago. Symptoms have not been recurrent. He mentioned some reflux around that time before asthma type symptoms.  GERD (gastroesophageal reflux disease) pt states in past about 5 years ago needed prilosec brief time But now controlled only with diet. Gets reflux rare maybe twice a year  Allergic rhinitis Occasional seasonal. Will rx montelukast in future if needed.  HYPERLIPIDEMIA Will get cmp and lipid panel today.  Solitary pulmonary nodule Will put in order for ct.     Will send referral for GI and see if you are due for repeat colonoscopy.  Follow up date to be determined. Likely 3 months. We could do complete physical at that time.

## 2014-10-25 MED ORDER — FENOFIBRATE 145 MG PO TABS: 145.0000 mg | ORAL_TABLET | Freq: Every day | ORAL | Status: DC

## 2014-10-25 NOTE — Addendum Note (Signed)
Addended by: Neldon Labella on: 10/25/2014 08:25 AM   Modules accepted: Orders

## 2014-10-25 NOTE — Addendum Note (Signed)
Addended by: Neldon Labella on: 10/25/2014 08:19 AM   Modules accepted: Orders

## 2014-11-04 ENCOUNTER — Telehealth: Payer: Self-pay | Admitting: Medical

## 2014-11-04 NOTE — Telephone Encounter (Signed)
Edward please advise.  

## 2014-11-04 NOTE — Telephone Encounter (Signed)
Spoke with wife, she wants to move forward with referral to [facility], records have been faxed awaiting appointment

## 2014-11-04 NOTE — Telephone Encounter (Signed)
Will you refer pt to other GI. Pt made this request. Also he does not know who his prior GI MD was. Maybe refer back to prior if you can find who it was.

## 2014-11-04 NOTE — Telephone Encounter (Signed)
Caller name: Stacey Drainerry Sloan  Relationship to patient: spouse  Can be reached: [phone]  Reason for call: Pt's wife called in because she says that the pt was referred to get a colonoscopy and was advised that he has to have records from a previous colonoscopy. Pt isn't sure of where he went. They would like to know if they can be referred to a different location because she says that they were rude.   Please Advise.

## 2014-11-08 ENCOUNTER — Other Ambulatory Visit (INDEPENDENT_AMBULATORY_CARE_PROVIDER_SITE_OTHER): Payer: 59

## 2014-11-08 DIAGNOSIS — R739 Hyperglycemia, unspecified: Secondary | ICD-10-CM

## 2014-11-08 DIAGNOSIS — R7309 Other abnormal glucose: Secondary | ICD-10-CM

## 2014-11-08 DIAGNOSIS — E785 Hyperlipidemia, unspecified: Secondary | ICD-10-CM | POA: Diagnosis not present

## 2014-11-08 LAB — COMPREHENSIVE METABOLIC PANEL
ALT: 29 U/L (ref 0–53)
AST: 16 U/L (ref 0–37)
Albumin: 4.2 g/dL (ref 3.5–5.2)
Alkaline Phosphatase: 76 U/L (ref 39–117)
BILIRUBIN TOTAL: 0.5 mg/dL (ref 0.2–1.2)
BUN: 17 mg/dL (ref 6–23)
CALCIUM: 9.9 mg/dL (ref 8.4–10.5)
CO2: 27 meq/L (ref 19–32)
CREATININE: 0.9 mg/dL (ref 0.40–1.50)
Chloride: 103 mEq/L (ref 96–112)
GFR: 92.99 mL/min (ref >60.00)
Glucose, Bld: 125 mg/dL — ABNORMAL HIGH (ref 70–99)
Potassium: 3.9 mEq/L (ref 3.5–5.1)
Sodium: 138 mEq/L (ref 135–145)
Total Protein: 7 g/dL (ref 6.0–8.3)

## 2014-11-08 LAB — LDL CHOLESTEROL, DIRECT: Direct LDL: 56 mg/dL

## 2014-11-08 LAB — LIPID PANEL
Cholesterol: 223 mg/dL — ABNORMAL HIGH (ref 0–200)
HDL: 32.3 mg/dL — AB (ref >39.00)
Total CHOL/HDL Ratio: 7
Triglycerides: 894 mg/dL — ABNORMAL HIGH (ref 0.0–149.0)

## 2014-11-08 LAB — HEMOGLOBIN A1C: Hgb A1c MFr Bld: 6.1 % (ref 4.6–6.5)

## 2015-01-24 ENCOUNTER — Other Ambulatory Visit: Payer: 59

## 2015-01-25 ENCOUNTER — Other Ambulatory Visit: Payer: 59

## 2015-01-28 ENCOUNTER — Other Ambulatory Visit: Payer: 59

## 2015-03-25 ENCOUNTER — Other Ambulatory Visit (INDEPENDENT_AMBULATORY_CARE_PROVIDER_SITE_OTHER): Payer: 59

## 2015-03-25 DIAGNOSIS — E785 Hyperlipidemia, unspecified: Secondary | ICD-10-CM | POA: Diagnosis not present

## 2015-03-25 LAB — LIPID PANEL
Cholesterol: 198 mg/dL (ref 0–200)
HDL: 29.2 mg/dL — AB (ref >39.00)
Total CHOL/HDL Ratio: 7

## 2015-03-25 LAB — LDL CHOLESTEROL, DIRECT: LDL DIRECT: 49 mg/dL

## 2015-03-27 ENCOUNTER — Telehealth: Payer: Self-pay | Admitting: Medical

## 2015-03-27 MED ORDER — ROSUVASTATIN CALCIUM 20 MG PO TABS: 20.0000 mg | ORAL_TABLET | Freq: Every day | ORAL | Status: DC

## 2015-03-27 NOTE — Telephone Encounter (Signed)
I sent in crestor as 30 tabs with 3 refills. Will you change that to 90 tabs with one refill. Since I noticed after sent rx that it is a mail order pharmacy. Cancel the 30 tab rx.

## 2015-03-28 MED ORDER — ROSUVASTATIN CALCIUM 20 MG PO TABS: 20.0000 mg | ORAL_TABLET | Freq: Every day | ORAL | Status: DC

## 2015-03-28 NOTE — Telephone Encounter (Signed)
Spoke with pharmacist and he cancelled 30 day supply to pharmacy. Per pcp pt is to be on 90 day supply sent to pharmacy. Left message advising pt to follow up with pcp in the next two months.

## 2015-03-30 NOTE — Progress Notes (Signed)
Left message for return call from patient regarding labs.

## 2015-04-04 NOTE — Progress Notes (Signed)
Quick Note:  Pt has seen results on MyChart and message also sent for patient to call back if any questions. ______ 

## 2015-06-24 ENCOUNTER — Other Ambulatory Visit: Payer: Self-pay | Admitting: Medical

## 2015-06-24 DIAGNOSIS — E785 Hyperlipidemia, unspecified: Secondary | ICD-10-CM

## 2015-06-24 NOTE — Telephone Encounter (Signed)
Pt has extreme high triglycerides. He needs to have a lipid panel. I refilled his crestor. But is he still on fenofibrate. Will you put in lipid panel so he can get done next week fasting.

## 2015-06-24 NOTE — Telephone Encounter (Signed)
Spoke with pt and he has a lab appointment for 07/04/15 at 730 am for fasting lipid panel. Pt was advised that the provider may have him come in for an office visit after the repeat blood work.

## 2015-07-04 ENCOUNTER — Other Ambulatory Visit (INDEPENDENT_AMBULATORY_CARE_PROVIDER_SITE_OTHER): Payer: 59

## 2015-07-04 DIAGNOSIS — E785 Hyperlipidemia, unspecified: Secondary | ICD-10-CM

## 2015-07-04 LAB — LDL CHOLESTEROL, DIRECT: LDL DIRECT: 41 mg/dL

## 2015-07-04 LAB — LIPID PANEL
CHOL/HDL RATIO: 4
CHOLESTEROL: 151 mg/dL (ref 0–200)
HDL: 35.5 mg/dL — ABNORMAL LOW (ref >39.00)
Triglycerides: 491 mg/dL — ABNORMAL HIGH (ref 0.0–149.0)

## 2015-07-15 ENCOUNTER — Telehealth: Payer: Self-pay | Admitting: *Deleted

## 2015-07-15 NOTE — Telephone Encounter (Signed)
Pt's wife called in to verify dosing of crestor. Pt thought the dose was supposed to be 120 mg, not 20 mg. Confirmed w/ pt's wife that 20 mg daily is the correct dose. No further questions or concerns at time of call.

## 2015-07-23 ENCOUNTER — Encounter (HOSPITAL_BASED_OUTPATIENT_CLINIC_OR_DEPARTMENT_OTHER): Payer: Self-pay | Admitting: Emergency Medicine

## 2015-07-23 ENCOUNTER — Emergency Department (HOSPITAL_BASED_OUTPATIENT_CLINIC_OR_DEPARTMENT_OTHER)
Admission: EM | Admit: 2015-07-23 | Discharge: 2015-07-24 | Disposition: A | Discharge status: Home / Self Care | Payer: 59 | Attending: Emergency Medicine | Admitting: Emergency Medicine

## 2015-07-23 DIAGNOSIS — Y9289 Other specified places as the place of occurrence of the external cause: Secondary | ICD-10-CM | POA: Diagnosis not present

## 2015-07-23 DIAGNOSIS — X58XXXA Exposure to other specified factors, initial encounter: Secondary | ICD-10-CM | POA: Insufficient documentation

## 2015-07-23 DIAGNOSIS — H5712 Ocular pain, left eye: Secondary | ICD-10-CM | POA: Diagnosis present

## 2015-07-23 DIAGNOSIS — S0502XA Injury of conjunctiva and corneal abrasion without foreign body, left eye, initial encounter: Secondary | ICD-10-CM | POA: Diagnosis not present

## 2015-07-23 DIAGNOSIS — Y9389 Activity, other specified: Secondary | ICD-10-CM | POA: Insufficient documentation

## 2015-07-23 DIAGNOSIS — J45909 Unspecified asthma, uncomplicated: Secondary | ICD-10-CM | POA: Insufficient documentation

## 2015-07-23 DIAGNOSIS — E785 Hyperlipidemia, unspecified: Secondary | ICD-10-CM | POA: Diagnosis not present

## 2015-07-23 DIAGNOSIS — Y999 Unspecified external cause status: Secondary | ICD-10-CM | POA: Diagnosis not present

## 2015-07-23 MED ORDER — FLUORESCEIN SODIUM 1 MG OP STRP
1.0000 | ORAL_STRIP | Freq: Once | OPHTHALMIC | Status: AC
  Administered 2015-07-23: 1 via OPHTHALMIC
  Filled 2015-07-23: qty 1

## 2015-07-23 MED ORDER — TETRACAINE HCL 0.5 % OP SOLN
2.0000 [drp] | Freq: Once | OPHTHALMIC | Status: AC
  Administered 2015-07-23: 2 [drp] via OPHTHALMIC
  Filled 2015-07-23: qty 4

## 2015-07-23 MED ORDER — OXYCODONE-ACETAMINOPHEN 5-325 MG PO TABS
2.0000 | ORAL_TABLET | Freq: Once | ORAL | Status: AC
  Administered 2015-07-23: 2 via ORAL
  Filled 2015-07-23: qty 2

## 2015-07-23 MED ORDER — OXYCODONE-ACETAMINOPHEN 5-325 MG PO TABS: 1.0000 | ORAL_TABLET | Freq: Four times a day (QID) | ORAL | Status: DC | PRN

## 2015-07-23 NOTE — ED Notes (Signed)
Pt given d/c instructions as per chart. Rx x 1 with narc and tyl precautions. Verbalizes understanding. No questions.

## 2015-07-23 NOTE — ED Notes (Signed)
Pt given Ciprofloxin optic drops at 1500 today has take 2 doses today

## 2015-07-23 NOTE — ED Notes (Signed)
Patient wears glasses and had same on during exam.

## 2015-07-23 NOTE — ED Provider Notes (Signed)
CSN: [id number]     Arrival date & time 07/23/15  2106 History   By signing my name below, I, Justin Richard, attest that this documentation has been prepared under the direction and in the presence of [name], PA-C. Electronically Signed: [name], ED Scribe. 07/23/2015. 10:43 PM.    Chief Complaint  Patient presents with  . Eye Pain   Patient is a 55 y.o. male presenting with eye pain. The history is provided by the patient. No language interpreter was used.  Eye Pain   HPI Comments: Burns SpainDavid A Richard is a 55 y.o. male who presents to the Emergency Department complaining of left eye pain onset today at 3 PM. Pt states that he was working around a fiber glass and dusty area. Pt states that he feels as if a piece a fiber glass is in his eye. He states that he has associated tearing. Pt states that he was evaluated at urgent care earlier today for the same pain and was diagnosed with a cornea abrasion. He states that he was prescribed ciprofloxacin drops. Pt states that the pain is worse when blinking. Pt states that he has tried tylenol with no relief.   Past Medical History  Diagnosis Date  . Hyperlipidemia   . Asthma   . Chicken pox   . GERD (gastroesophageal reflux disease)   . Allergy    Past Surgical History  Procedure Laterality Date  . Bronchoscopy     Family History  Problem Relation Age of Onset  . Arthritis Mother   . Cancer Mother   . Hyperlipidemia Mother   . Diabetes Mother   . Arthritis Father   . Cancer Father   . Hyperlipidemia Father   . Diabetes Father   . Cancer Maternal Grandmother   . Diabetes Maternal Grandmother   . Cancer Maternal Grandfather   . Diabetes Maternal Grandfather   . Cancer Paternal Grandmother   . Diabetes Paternal Grandmother   . Cancer Paternal Grandfather   . Diabetes Paternal Grandfather    Social History  Substance Use Topics  . Smoking status: Never Smoker   . Smokeless tobacco: Never Used  . Alcohol Use: No     Review of Systems  Eyes: Positive for pain.   A complete 10 system review of systems was obtained and all systems are negative except as noted in the HPI and PMH.     Allergies  Codeine  Home Medications   Prior to Admission medications   Medication Sig Start Date End Date Taking? Authorizing Provider  fenofibrate (TRICOR) 145 MG tablet Take 1 tablet (145 mg total) by mouth daily. 10/25/14   Ramon DredgeEdward Saguier, PA-C  rosuvastatin (CRESTOR) 20 MG tablet Take 1 tablet by mouth  daily 06/24/15   Ramon DredgeEdward Saguier, PA-C   BP 140/79 mmHg  Pulse 69  Temp(Src) 98.4 F (36.9 C) (Oral)  Resp 18  Ht 6\' 1"  (1.854 m)  Wt 260 lb (117.935 kg)  BMI 34.31 kg/m2  SpO2 98%   Physical Exam  Constitutional: He is oriented to person, place, and time. He appears well-developed and well-nourished. No distress.  HENT:  Head: Normocephalic and atraumatic.  Eyes: EOM are normal. Pupils are equal, round, and reactive to light. Right eye exhibits no discharge. No foreign body present in the right eye. Left eye exhibits no discharge. No foreign body present in the left eye. Left conjunctiva is injected.  Slit lamp exam:      The left eye shows corneal abrasion.  The left eye shows no corneal flare and no corneal ulcer.    Neck: Neck supple. No tracheal deviation present.  Cardiovascular: Normal rate and regular rhythm.   Pulmonary/Chest: Effort normal and breath sounds normal. No respiratory distress.  Musculoskeletal: Normal range of motion.  Neurological: He is alert and oriented to person, place, and time.  Skin: Skin is warm and dry.  Psychiatric: He has a normal mood and affect. His behavior is normal.  Nursing note and vitals reviewed.   ED Course  Procedures (including critical care time) DIAGNOSTIC STUDIES: Oxygen Saturation is 98% on RA, normal by my interpretation.    COORDINATION OF CARE: 10:41 PM-Discussed treatment plan with pt at bedside and pt agreed to plan.     Labs  Review Labs Reviewed - No data to display  Imaging Review No results found.    EKG Interpretation None      MDM   Final diagnoses:  None  Patient presents today with left eye pain.  He feels that he may have a small piece of fiberglass in his eye.  On exam, he does have a corneal abrasion of the left eye.  No foreign body visualized with slit lamp.  Patient given Rx for short course of pain medication.  He was given Rx for antibiotic eye drops at his visit with Urgent Care today.  Patient given follow up with Opthalmology.  Return precautions given.  I personally performed the services described in this documentation, which was scribed in my presence. The recorded information has been reviewed and is accurate.      [name], PA-C 07/24/15 0111  Doug Sou, MD 07/24/15 [phone]

## 2015-07-23 NOTE — ED Notes (Signed)
Pt in c/o L eye pain from possible splinter or fiber glass. [facility] with woods lamp and found no foreign body, but did note abrasion. Ambulatory in NAD.

## 2015-07-23 NOTE — ED Provider Notes (Signed)
Patient felt that tiny piece of fiberglass go into left eye 3 PM today. Thank you he complains of foreign body sensation in left eye. He denies visual changes. He was prescribed Cipro eyedrops when seen earlier today. On exam patient is alert and in no distress. Left eye examined by slit lamp there is some conjunctival erythema. No foreign body seen on double lid eversion  Doug SouSam Nayana Lenig, MD 07/23/15 [id number]

## 2015-11-18 ENCOUNTER — Ambulatory Visit (INDEPENDENT_AMBULATORY_CARE_PROVIDER_SITE_OTHER): Payer: 59 | Admitting: Medical

## 2015-11-18 ENCOUNTER — Encounter: Payer: Self-pay | Admitting: Medical

## 2015-11-18 VITALS — BP 126/80 | HR 87 | Temp 98.1°F | Ht 72.0 in | Wt 261.0 lb

## 2015-11-18 DIAGNOSIS — J209 Acute bronchitis, unspecified: Secondary | ICD-10-CM | POA: Diagnosis not present

## 2015-11-18 DIAGNOSIS — E785 Hyperlipidemia, unspecified: Secondary | ICD-10-CM

## 2015-11-18 DIAGNOSIS — J0100 Acute maxillary sinusitis, unspecified: Secondary | ICD-10-CM | POA: Diagnosis not present

## 2015-11-18 DIAGNOSIS — R062 Wheezing: Secondary | ICD-10-CM

## 2015-11-18 MED ORDER — ROSUVASTATIN CALCIUM 20 MG PO TABS: 20.0000 mg | ORAL_TABLET | Freq: Every day | ORAL | 0 refills | Status: DC

## 2015-11-18 MED ORDER — BENZONATATE 100 MG PO CAPS: 100.0000 mg | ORAL_CAPSULE | Freq: Three times a day (TID) | ORAL | 0 refills | Status: DC | PRN

## 2015-11-18 MED ORDER — DOXYCYCLINE HYCLATE 100 MG PO TABS: 100.0000 mg | ORAL_TABLET | Freq: Two times a day (BID) | ORAL | 0 refills | Status: DC

## 2015-11-18 MED ORDER — FLUTICASONE PROPIONATE 50 MCG/ACT NA SUSP: 2.0000 | Freq: Every day | NASAL | 1 refills | Status: DC

## 2015-11-18 MED ORDER — ALBUTEROL SULFATE HFA 108 (90 BASE) MCG/ACT IN AERS: 2.0000 | INHALATION_SPRAY | Freq: Four times a day (QID) | RESPIRATORY_TRACT | 0 refills | Status: DC | PRN

## 2015-11-18 MED ORDER — FENOFIBRATE 145 MG PO TABS: 145.0000 mg | ORAL_TABLET | Freq: Every day | ORAL | 5 refills | Status: DC

## 2015-11-18 MED FILL — FLUTICASONE PROP 50 MCG SPR: 50 | 30 days supply | Qty: 16 | Fill #0

## 2015-11-18 MED FILL — VENTOLIN HFA 90 MCG INHALER: 108 (90 BAS | 30 days supply | Qty: 18 | Fill #0

## 2015-11-18 MED FILL — DOXYCYCLINE HYCLATE 100 MG: 100 | 10 days supply | Qty: 20 | Fill #0

## 2015-11-18 MED FILL — FENOFIBRATE 145 MG TABLET: 145 | 30 days supply | Qty: 30 | Fill #0

## 2015-11-18 MED FILL — BENZONATATE 100 MG CAPSULE: 100 | 7 days supply | Qty: 21 | Fill #0

## 2015-11-18 MED FILL — ROSUVASTATIN CALCIUM 20 MG: 20 | 90 days supply | Qty: 90 | Fill #0

## 2015-11-18 NOTE — Progress Notes (Signed)
Pre visit review using our clinic review tool, if applicable. No additional management support is needed unless otherwise documented below in the visit note. 

## 2015-11-18 NOTE — Patient Instructions (Signed)
You appear to have bronchitis and sinusitis. Rest hydrate and tylenol for fever. I am prescribing cough medicine benzonatate , and doxycycline antibiotic. For your nasal congestion you could flonase nasal steroid.  For hx of wheezing will make albuterol available. If using frequently.   You should gradually get better. If not then notify us and would recommend a chest xray.  Follow up in 7-10 days or as needed

## 2015-11-18 NOTE — Progress Notes (Signed)
Subjective:    Patient ID: Justin Richard, male    DOB: 1960-09-02, 55 y.o.   MRN: [id number]  HPI  Pt in with 10 days of  nasal congestion and now chest congestion. Some ear pain 1 wk ago. Faint sinus pressure. Has pnd. Some sneezing in am. Cough up mucous. Faint transient occasional brief occasional wheezing.  Review of Systems  Constitutional: Negative for chills, fatigue and unexpected weight change.  HENT: Positive for congestion, ear pain, sinus pressure and sore throat. Negative for nosebleeds.        St at beginning.  Respiratory: Positive for cough and wheezing. Negative for chest tightness.   Cardiovascular: Negative for chest pain and palpitations.  Gastrointestinal: Negative for abdominal pain, diarrhea and nausea.  Musculoskeletal: Negative for back pain and myalgias.  Neurological: Negative for weakness and headaches.  Psychiatric/Behavioral: Negative for behavioral problems and confusion.   Past Medical History:  Diagnosis Date  . Allergy   . Asthma   . Chicken pox   . GERD (gastroesophageal reflux disease)   . Hyperlipidemia      Social History   Social History  . Marital status: Married    Spouse name: N/A  . Number of children: N/A  . Years of education: N/A   Occupational History  . Not on file.   Social History Main Topics  . Smoking status: Never Smoker  . Smokeless tobacco: Never Used  . Alcohol use No  . Drug use: No  . Sexual activity: Not on file   Other Topics Concern  . Not on file   Social History Narrative  . No narrative on file    Past Surgical History:  Procedure Laterality Date  . BRONCHOSCOPY      Family History  Problem Relation Age of Onset  . Arthritis Mother   . Cancer Mother   . Hyperlipidemia Mother   . Diabetes Mother   . Arthritis Father   . Cancer Father   . Hyperlipidemia Father   . Diabetes Father   . Cancer Maternal Grandmother   . Diabetes Maternal Grandmother   . Cancer Maternal Grandfather   .  Diabetes Maternal Grandfather   . Cancer Paternal Grandmother   . Diabetes Paternal Grandmother   . Cancer Paternal Grandfather   . Diabetes Paternal Grandfather     Allergies  Allergen Reactions  . Codeine Nausea Only    Current Outpatient Prescriptions on File Prior to Visit  Medication Sig Dispense Refill  . fenofibrate (TRICOR) 145 MG tablet Take 1 tablet (145 mg total) by mouth daily. 30 tablet 5  . rosuvastatin (CRESTOR) 20 MG tablet Take 1 tablet by mouth  daily 90 tablet 0   No current facility-administered medications on file prior to visit.     BP 126/80 (BP Location: Left Arm, Patient Position: Sitting)   Pulse 87   Temp 98.1 F (36.7 C) (Oral)   Ht 6' (1.829 m)   Wt 261 lb (118.4 kg)   SpO2 96%   BMI 35.40 kg/m       Objective:   Physical Exam  General  Mental Status - Alert. General Appearance - Well groomed. Not in acute distress.  Skin Rashes- No Rashes.  HEENT Head- Normal. Ear Auditory Canal - Left- Normal. Right - Normal.Tympanic Membrane- Left- Normal. Right- Normal. Eye Sclera/Conjunctiva- Left- Normal. Right- Normal. Nose & Sinuses Nasal Mucosa- Left-  Boggy and Congested. Right-  Boggy and  Congested.Bilateral faint  maxillary and  Faint frontal sinus  pressure. Mouth & Throat Lips: Upper Lip- Normal: no dryness, cracking, pallor, cyanosis, or vesicular eruption. Lower Lip-Normal: no dryness, cracking, pallor, cyanosis or vesicular eruption. Buccal Mucosa- Bilateral- No Aphthous ulcers. Oropharynx- No Discharge or Erythema. Tonsils: Characteristics- Bilateral- No Erythema or Congestion. Size/Enlargement- Bilateral- No enlargement. Discharge- bilateral-None.  Neck Neck- Supple. No Masses.   Chest and Lung Exam Auscultation: Breath Sounds:-Clear even and unlabored.  Cardiovascular Auscultation:Rythm- Regular, rate and rhythm. Murmurs & Other Heart Sounds:Ausculatation of the heart reveal- No Murmurs.  Lymphatic Head & Neck General  Head & Neck Lymphatics: Bilateral: Description- No Localized lymphadenopathy.       Assessment & Plan:   You appear to have bronchitis and sinusitis. Rest hydrate and tylenol for fever. I am prescribing cough medicine benzonatate , and doxycycline antibiotic. For your nasal congestion you could flonase nasal steroid.  For hx of wheezing will make albuterol available. If using frequently.   You should gradually get better. If not then notify us and would recommend a chest xray.  Follow up in 7-10 days or as needed  I did put in future order for lipid panel. He states had not been taking fenofibrate. Refilled his crestor and fenofibrate. Rx advisement given. Repeat lipid panel fasting in 3 months.  Get flu vaccine in one week or so when over current illness.    Eleny Cortez, Ramon DredgeEdward, PA-C

## 2015-12-06 ENCOUNTER — Encounter: Payer: Self-pay | Admitting: Medical

## 2015-12-07 ENCOUNTER — Other Ambulatory Visit (INDEPENDENT_AMBULATORY_CARE_PROVIDER_SITE_OTHER): Payer: 59

## 2015-12-07 DIAGNOSIS — E785 Hyperlipidemia, unspecified: Secondary | ICD-10-CM

## 2015-12-07 LAB — COMPREHENSIVE METABOLIC PANEL
ALBUMIN: 4.3 g/dL (ref 3.6–5.1)
ALT: 49 U/L — ABNORMAL HIGH (ref 9–46)
AST: 20 U/L (ref 10–35)
Alkaline Phosphatase: 127 U/L — ABNORMAL HIGH (ref 40–115)
BUN: 20 mg/dL (ref 7–25)
CALCIUM: 9.9 mg/dL (ref 8.6–10.3)
CHLORIDE: 102 mmol/L (ref 98–110)
CO2: 27 mmol/L (ref 20–31)
Creat: 1.06 mg/dL (ref 0.70–1.33)
Glucose, Bld: 287 mg/dL — ABNORMAL HIGH (ref 65–99)
POTASSIUM: 4.1 mmol/L (ref 3.5–5.3)
Sodium: 138 mmol/L (ref 135–146)
TOTAL PROTEIN: 6.9 g/dL (ref 6.1–8.1)
Total Bilirubin: 0.7 mg/dL (ref 0.2–1.2)

## 2015-12-07 LAB — LIPID PANEL
CHOL/HDL RATIO: 4.2 ratio (ref <5.0)
CHOLESTEROL: 162 mg/dL (ref <200)
HDL: 39 mg/dL — AB (ref >40)
LDL Cholesterol: 48 mg/dL (ref <100)
TRIGLYCERIDES: 377 mg/dL — AB (ref <150)
VLDL: 75 mg/dL — AB (ref <30)

## 2015-12-07 NOTE — Telephone Encounter (Signed)
Patient's wife called to schedule patient's lab appointment for this afternoon. Patient has some orders in for labs including lipid panel. Patient is not fasting and wanted to make sure this was okay. Also, per this email, it looks like Ramon Dredgedward wants a urine sample and possibly prostate labs done as well, those are not in the system. Patient has a 4:00pm appointment with the lab today, please advise.   Wife Phone:[phone]

## 2015-12-10 MED ORDER — METFORMIN HCL ER 500 MG PO TB24: 500.0000 mg | ORAL_TABLET | Freq: Every day | ORAL | 3 refills | Status: DC

## 2015-12-10 NOTE — Telephone Encounter (Signed)
I did sent metformin to his pharmacy. Appears pt did not fast. Wife called and wanted to know if that was ok. Not ideal but same advise stand on result note. Continue medication for his triglycerides as well. See results note.

## 2015-12-12 ENCOUNTER — Other Ambulatory Visit (INDEPENDENT_AMBULATORY_CARE_PROVIDER_SITE_OTHER): Payer: 59

## 2015-12-12 DIAGNOSIS — R739 Hyperglycemia, unspecified: Secondary | ICD-10-CM | POA: Diagnosis not present

## 2015-12-12 LAB — HEMOGLOBIN A1C: HEMOGLOBIN A1C: 10.5 % — AB (ref 4.6–6.5)

## 2015-12-12 MED FILL — METFORMIN HCL ER 500 MG TAB: 500 | 30 days supply | Qty: 30 | Fill #0

## 2015-12-13 NOTE — Progress Notes (Signed)
Pt has seen results on MyChart and message also sent for patient to call back if any questions.

## 2015-12-19 MED FILL — FENOFIBRATE 145 MG TABLET: 145 | 30 days supply | Qty: 30 | Fill #1

## 2016-01-04 MED FILL — METFORMIN HCL ER 500 MG TAB: 500 | 30 days supply | Qty: 30 | Fill #1

## 2016-01-18 MED FILL — FENOFIBRATE 145 MG TABLET: 145 | 30 days supply | Qty: 30 | Fill #2

## 2016-02-06 MED FILL — METFORMIN HCL ER 500 MG TAB: 500 | 30 days supply | Qty: 30 | Fill #2

## 2016-02-09 ENCOUNTER — Other Ambulatory Visit (INDEPENDENT_AMBULATORY_CARE_PROVIDER_SITE_OTHER): Payer: 59

## 2016-02-09 DIAGNOSIS — E785 Hyperlipidemia, unspecified: Secondary | ICD-10-CM

## 2016-02-09 LAB — COMPREHENSIVE METABOLIC PANEL
ALT: 26 U/L (ref 0–53)
AST: 18 U/L (ref 0–37)
Albumin: 4.2 g/dL (ref 3.5–5.2)
Alkaline Phosphatase: 52 U/L (ref 39–117)
BUN: 18 mg/dL (ref 6–23)
CHLORIDE: 106 meq/L (ref 96–112)
CO2: 28 meq/L (ref 19–32)
Calcium: 9.5 mg/dL (ref 8.4–10.5)
Creatinine, Ser: 1.15 mg/dL (ref 0.40–1.50)
GFR: 69.76 mL/min (ref >60.00)
GLUCOSE: 143 mg/dL — AB (ref 70–99)
POTASSIUM: 4.2 meq/L (ref 3.5–5.1)
SODIUM: 140 meq/L (ref 135–145)
Total Bilirubin: 0.4 mg/dL (ref 0.2–1.2)
Total Protein: 6.7 g/dL (ref 6.0–8.3)

## 2016-02-09 LAB — LIPID PANEL
CHOL/HDL RATIO: 3
CHOLESTEROL: 108 mg/dL (ref 0–200)
HDL: 35.8 mg/dL — ABNORMAL LOW (ref >39.00)
LDL CALC: 36 mg/dL (ref 0–99)
NONHDL: 72.19
Triglycerides: 183 mg/dL — ABNORMAL HIGH (ref 0.0–149.0)
VLDL: 36.6 mg/dL (ref 0.0–40.0)

## 2016-02-11 ENCOUNTER — Telehealth: Payer: Self-pay | Admitting: Medical

## 2016-02-11 DIAGNOSIS — E118 Type 2 diabetes mellitus with unspecified complications: Secondary | ICD-10-CM

## 2016-02-11 NOTE — Telephone Encounter (Signed)
Future a1c placed. 

## 2016-02-13 NOTE — Telephone Encounter (Signed)
Documented in patient result note/SLS 01/29

## 2016-02-15 ENCOUNTER — Other Ambulatory Visit: Payer: Self-pay | Admitting: Medical

## 2016-02-15 ENCOUNTER — Telehealth: Payer: Self-pay

## 2016-02-15 NOTE — Telephone Encounter (Signed)
PA initiated via Covermymeds; KEY: [id number]. Received real-time PA approval. Request Reference Number: [id number]. FENOFIBRATE TAB 145MG  is approved through 02/14/2017. For further questions, call [phone]

## 2016-02-16 MED FILL — FENOFIBRATE 145 MG TABLET: 145 | 30 days supply | Qty: 30 | Fill #3

## 2016-02-16 NOTE — Telephone Encounter (Signed)
Patient is calling to follow up on this medication refill. Please advise °

## 2016-02-17 MED FILL — ROSUVASTATIN CALCIUM 20 MG: 20 | 90 days supply | Qty: 90 | Fill #0

## 2016-02-17 NOTE — Telephone Encounter (Signed)
LVM informing patient rx was sent to pharmacy.

## 2016-02-17 NOTE — Telephone Encounter (Signed)
This Rx request came in on Wednesday, 02/15/16 at 7:36am, I cannot explain the cause of why this medication refill was not forwarded to pharmacy, as I have been out of the office since Tuesday, 02/14/16. Rx request to pharmacy/SLS 02/02

## 2016-03-05 MED FILL — METFORMIN HCL ER 500 MG TAB: 500 | 30 days supply | Qty: 30 | Fill #3

## 2016-03-15 ENCOUNTER — Other Ambulatory Visit (INDEPENDENT_AMBULATORY_CARE_PROVIDER_SITE_OTHER): Payer: 59

## 2016-03-15 DIAGNOSIS — E118 Type 2 diabetes mellitus with unspecified complications: Secondary | ICD-10-CM | POA: Diagnosis not present

## 2016-03-15 LAB — HEMOGLOBIN A1C: Hgb A1c MFr Bld: 7.1 % — ABNORMAL HIGH (ref 4.6–6.5)

## 2016-03-19 MED FILL — FENOFIBRATE 145 MG TAB: 145 | 30 days supply | Qty: 30 | Fill #4

## 2016-04-03 ENCOUNTER — Telehealth: Payer: Self-pay | Admitting: Medical

## 2016-04-03 MED ORDER — METFORMIN HCL ER 500 MG PO TB24: 500.0000 mg | ORAL_TABLET | Freq: Every day | ORAL | 3 refills | Status: DC

## 2016-04-03 MED FILL — METFORMIN HCL ER 500 MG TAB: 500 | 30 days supply | Qty: 30 | Fill #0

## 2016-04-03 NOTE — Telephone Encounter (Signed)
Per provider, Ok to fill [up-to-date on labs]; Rx to pharmacy/SLS 03/20

## 2016-04-03 NOTE — Telephone Encounter (Signed)
Relation to JX:BJYNpt:self Call back number:[phone] Pharm[facility]: [facility] [place] - [address] [phone] (Phone) [phone] (Fax)     Reason for call:  Patient requesting a refill metFORMIN (GLUCOPHAGE XR) 500 MG 24 hr tablet

## 2016-04-17 MED FILL — FENOFIBRATE 145 MG TAB: 145 | 30 days supply | Qty: 30 | Fill #5

## 2016-05-07 MED FILL — METFORMIN HCL ER 500 MG TAB: 500 | 30 days supply | Qty: 30 | Fill #1

## 2016-05-15 ENCOUNTER — Telehealth: Payer: Self-pay | Admitting: Medical

## 2016-05-15 MED ORDER — ROSUVASTATIN CALCIUM 20 MG PO TABS: 20.0000 mg | ORAL_TABLET | Freq: Every day | ORAL | 0 refills | Status: DC

## 2016-05-15 MED ORDER — FENOFIBRATE 145 MG PO TABS: 145.0000 mg | ORAL_TABLET | Freq: Every day | ORAL | 5 refills | Status: DC

## 2016-05-15 MED FILL — ROSUVASTATIN CALCIUM 20 MG: 20 | 90 days supply | Qty: 90 | Fill #0

## 2016-05-15 MED FILL — FENOFIBRATE 145 MG TAB: 145 | 30 days supply | Qty: 30 | Fill #0

## 2016-05-15 NOTE — Telephone Encounter (Signed)
Caller name: Labrandon Relation to pt: self Call back number: [phone] Pharmacy: [facility]  Reason for call: Pt is needing refill on meds for fenofibrate (TRICOR) 145 MG tablet and rosuvastatin (CRESTOR) 20 MG tablet, pt mentioned will be out of meds by Monday. Please advise.

## 2016-05-17 NOTE — Telephone Encounter (Signed)
Sent rx to pharmacy

## 2016-06-04 MED FILL — METFORMIN HCL ER 500 MG TAB: 500 | 30 days supply | Qty: 30 | Fill #2

## 2016-06-22 MED FILL — FENOFIBRATE 145 MG TAB: 145 | 30 days supply | Qty: 30 | Fill #1

## 2016-07-03 MED FILL — METFORMIN HCL ER 500 MG TAB: 500 | 30 days supply | Qty: 30 | Fill #3

## 2016-07-23 MED FILL — FENOFIBRATE 145 MG TAB: 145 | 30 days supply | Qty: 30 | Fill #2

## 2016-07-25 ENCOUNTER — Other Ambulatory Visit: Payer: Self-pay | Admitting: Medical

## 2016-08-02 MED FILL — METFORMIN HCL ER 500 MG TAB: 500 | 30 days supply | Qty: 30 | Fill #0

## 2016-08-02 NOTE — Telephone Encounter (Signed)
Patient scheduled for Thu 08/16/2016 at 8:15am.

## 2016-08-02 NOTE — Telephone Encounter (Signed)
Pt due for follow up please call and schedule appointment.  

## 2016-08-16 ENCOUNTER — Ambulatory Visit (HOSPITAL_BASED_OUTPATIENT_CLINIC_OR_DEPARTMENT_OTHER)
Admission: RE | Admit: 2016-08-16 | Discharge: 2016-08-16 | Disposition: A | Discharge status: Home / Self Care | Payer: 59 | Source: Ambulatory Visit | Attending: Medical | Admitting: Medical

## 2016-08-16 ENCOUNTER — Telehealth: Payer: Self-pay | Admitting: Medical

## 2016-08-16 ENCOUNTER — Ambulatory Visit (INDEPENDENT_AMBULATORY_CARE_PROVIDER_SITE_OTHER): Payer: 59 | Admitting: Medical

## 2016-08-16 ENCOUNTER — Encounter: Payer: Self-pay | Admitting: Medical

## 2016-08-16 VITALS — BP 137/69 | HR 58 | Temp 98.2°F | Resp 16 | Ht 72.0 in | Wt 251.6 lb

## 2016-08-16 DIAGNOSIS — E118 Type 2 diabetes mellitus with unspecified complications: Secondary | ICD-10-CM | POA: Diagnosis not present

## 2016-08-16 DIAGNOSIS — M79672 Pain in left foot: Secondary | ICD-10-CM

## 2016-08-16 DIAGNOSIS — M79673 Pain in unspecified foot: Secondary | ICD-10-CM

## 2016-08-16 DIAGNOSIS — M722 Plantar fascial fibromatosis: Secondary | ICD-10-CM | POA: Diagnosis not present

## 2016-08-16 DIAGNOSIS — M7732 Calcaneal spur, left foot: Secondary | ICD-10-CM | POA: Insufficient documentation

## 2016-08-16 DIAGNOSIS — E785 Hyperlipidemia, unspecified: Secondary | ICD-10-CM | POA: Diagnosis not present

## 2016-08-16 LAB — COMPLETE METABOLIC PANEL WITH GFR
ALT: 30 U/L (ref 9–46)
AST: 17 U/L (ref 10–35)
Albumin: 4.4 g/dL (ref 3.6–5.1)
Alkaline Phosphatase: 51 U/L (ref 40–115)
BILIRUBIN TOTAL: 0.6 mg/dL (ref 0.2–1.2)
BUN: 19 mg/dL (ref 7–25)
CO2: 24 mmol/L (ref 20–31)
Calcium: 9.5 mg/dL (ref 8.6–10.3)
Chloride: 105 mmol/L (ref 98–110)
Creat: 0.93 mg/dL (ref 0.70–1.33)
GFR, Est African American: >89 mL/min (ref >=60)
GLUCOSE: 137 mg/dL — AB (ref 65–99)
POTASSIUM: 4.3 mmol/L (ref 3.5–5.3)
SODIUM: 138 mmol/L (ref 135–146)
TOTAL PROTEIN: 6.5 g/dL (ref 6.1–8.1)

## 2016-08-16 LAB — LIPID PANEL
Cholesterol: 106 mg/dL (ref 0–200)
HDL: 43.4 mg/dL (ref >39.00)
LDL CALC: 33 mg/dL (ref 0–99)
NonHDL: 62.28
TRIGLYCERIDES: 144 mg/dL (ref 0.0–149.0)
Total CHOL/HDL Ratio: 2
VLDL: 28.8 mg/dL (ref 0.0–40.0)

## 2016-08-16 LAB — HEMOGLOBIN A1C: Hgb A1c MFr Bld: 7.3 % — ABNORMAL HIGH (ref 4.6–6.5)

## 2016-08-16 MED ORDER — ROSUVASTATIN CALCIUM 20 MG PO TABS: 20.0000 mg | ORAL_TABLET | Freq: Every day | ORAL | 1 refills | Status: DC

## 2016-08-16 MED ORDER — FENOFIBRATE 145 MG PO TABS
145.0000 mg | ORAL_TABLET | Freq: Every day | ORAL | 1 refills | Status: DC
Start: 2016-08-16 — End: 2016-12-27

## 2016-08-16 MED ORDER — METFORMIN HCL ER 500 MG PO TB24: ORAL_TABLET | ORAL | 1 refills | Status: DC

## 2016-08-16 NOTE — Telephone Encounter (Signed)
Notify pt that refilled his crestor, fenofibrate and metfomrin. Sent to mail order.

## 2016-08-16 NOTE — Telephone Encounter (Signed)
Referral to podiatist placed.

## 2016-08-16 NOTE — Progress Notes (Signed)
Subjective:    Patient ID: Justin Richard, male    DOB: 1960/11/26, 56 y.o.   MRN: [id number]  HPI  Pt in for a follow up.  Pt last a1c was 7.1. Pt states initially he was very strict on his diet. He has tried to lose weight with exercise and was successful. Last 2 months gained some weight again. Pt still using metformin. No side effects.  Pt has high cholesterol. Last check looked better than recent past.  Pt on crestor and fenofibrate. No side effects.    Review of Systems  Constitutional: Negative for chills, fatigue and fever.  Respiratory: Negative for cough, chest tightness, shortness of breath and wheezing.   Cardiovascular: Negative for chest pain and palpitations.  Gastrointestinal: Negative for abdominal pain, constipation, diarrhea, nausea and vomiting.  Endocrine: Negative for polydipsia, polyphagia and polyuria.  Musculoskeletal: Negative for back pain.       Left heel pain for 2 months. Worse in am.  Skin: Negative for rash.  Neurological: Negative for dizziness, weakness, numbness and headaches.  Hematological: Negative for adenopathy. Does not bruise/bleed easily.  Psychiatric/Behavioral: Negative for behavioral problems and confusion.   Past Medical History:  Diagnosis Date  . Allergy   . Asthma   . Chicken pox   . GERD (gastroesophageal reflux disease)   . Hyperlipidemia      Social History   Social History  . Marital status: Married    Spouse name: N/A  . Number of children: N/A  . Years of education: N/A   Occupational History  . Not on file.   Social History Main Topics  . Smoking status: Never Smoker  . Smokeless tobacco: Never Used  . Alcohol use No  . Drug use: No  . Sexual activity: Not on file   Other Topics Concern  . Not on file   Social History Narrative  . No narrative on file    Past Surgical History:  Procedure Laterality Date  . BRONCHOSCOPY      Family History  Problem Relation Age of Onset  . Arthritis Mother     . Cancer Mother   . Hyperlipidemia Mother   . Diabetes Mother   . Arthritis Father   . Cancer Father   . Hyperlipidemia Father   . Diabetes Father   . Cancer Maternal Grandmother   . Diabetes Maternal Grandmother   . Cancer Maternal Grandfather   . Diabetes Maternal Grandfather   . Cancer Paternal Grandmother   . Diabetes Paternal Grandmother   . Cancer Paternal Grandfather   . Diabetes Paternal Grandfather     Allergies  Allergen Reactions  . Codeine Nausea Only    Current Outpatient Prescriptions on File Prior to Visit  Medication Sig Dispense Refill  . albuterol (PROVENTIL HFA;VENTOLIN HFA) 108 (90 Base) MCG/ACT inhaler Inhale 2 puffs into the lungs every 6 (six) hours as needed for wheezing or shortness of breath. 1 Inhaler 0  . benzonatate (TESSALON) 100 MG capsule Take 1 capsule (100 mg total) by mouth 3 (three) times daily as needed for cough. 21 capsule 0  . doxycycline (VIBRA-TABS) 100 MG tablet Take 1 tablet (100 mg total) by mouth 2 (two) times daily. Can give caps or generic 20 tablet 0  . fenofibrate (TRICOR) 145 MG tablet Take 1 tablet (145 mg total) by mouth daily. 30 tablet 5  . fluticasone (FLONASE) 50 MCG/ACT nasal spray Place 2 sprays into both nostrils daily. 16 g 1  . metFORMIN (GLUCOPHAGE-XR) 500  MG 24 hr tablet TAKE 1 TABLET (500 MG TOTAL) BY MOUTH DAILY WITH BREAKFAST. 30 tablet 3  . rosuvastatin (CRESTOR) 20 MG tablet Take 1 tablet (20 mg total) by mouth daily. 90 tablet 0   No current facility-administered medications on file prior to visit.     BP 137/69   Pulse (!) 58   Temp 98.2 F (36.8 C) (Oral)   Resp 16   Ht 6' (1.829 m)   Wt 251 lb 9.6 oz (114.1 kg)   SpO2 95%   BMI 34.12 kg/m       Objective:   Physical Exam   General Mental Status- Alert. General Appearance- Not in acute distress.   Skin General: Color- Normal Color. Moisture- Normal Moisture.  Neck Carotid Arteries- Normal color. Moisture- Normal Moisture. No carotid  bruits. No JVD.  Chest and Lung Exam Auscultation: Breath Sounds:-Normal.  Cardiovascular Auscultation:Rythm- Regular. Murmurs & Other Heart Sounds:Auscultation of the heart reveals- No Murmurs.  Abdomen Inspection:-Inspeection Normal. Palpation/Percussion:Note:No mass. Palpation and Percussion of the abdomen reveal- Non Tender, Non Distended + BS, no rebound or guarding.    Neurologic Cranial Nerve exam:- CN III-XII intact(No nystagmus), symmetric smile. Strength:- 5/5 equal and symmetric strength both upper and lower extremities.   Lower ext- see quality metrics.  Left foot- no direct heel pain presently.     Assessment & Plan:  For diabetes continue diet and exercise. Will get a1c and cmp today. See if any med adjustments need to be made.  For high cholesterol repeat lipid panel today and follow results. See if med adjustment needed.  For heel pain will get xray of heel/foot. Continue conservative measures. May refer to podiatrist after xray review.  Follow up 3 months or as needed  Lanita Stammen, Ramon DredgeEdward, [employer] CorporationPA-C

## 2016-08-16 NOTE — Patient Instructions (Signed)
For diabetes continue diet and exercise. Will get a1c and cmp today. See if any med adjustments need to be made.  For high cholesterol repeat lipid panel today and follow results. See if med adjustment needed.  For heel pain will get xray of heel/foot. Continue conservative measures. May refer to podiatrist after xray review.  Follow up 3 months or as needed

## 2016-08-17 NOTE — Telephone Encounter (Signed)
Notified pt. 

## 2016-08-28 ENCOUNTER — Encounter: Payer: Self-pay | Admitting: Podiatry

## 2016-08-28 ENCOUNTER — Ambulatory Visit (INDEPENDENT_AMBULATORY_CARE_PROVIDER_SITE_OTHER): Payer: 59 | Admitting: Podiatry

## 2016-08-28 DIAGNOSIS — M722 Plantar fascial fibromatosis: Secondary | ICD-10-CM

## 2016-08-28 MED ORDER — MELOXICAM 15 MG PO TABS: 15.0000 mg | ORAL_TABLET | Freq: Every day | ORAL | 2 refills | Status: DC

## 2016-08-28 NOTE — Patient Instructions (Signed)

## 2016-08-28 NOTE — Progress Notes (Signed)
   Subjective:    Patient ID: Burns Spainavid A Viall, male    DOB: [birth year], 56 y.o.   MRN: [id number]  HPI  Mr. Truddie HiddenBolling resents the office today for concerns of heel pain which has been ongoing the last 2 months. He states he has pain in the morning when he first gets up or if he has be for some time and stands back up. He describes as a throbbing sensation to bottom of his heel. He previously had x-rays performed which did reveal a heel spur. He has had no recent treatment for this other t than ice. He denies any. The pain does not wake him up at night. He has no other complaints today.  Review of Systems  All other systems reviewed and are negative.      Objective:   Physical Exam General: AAO x3, NAD  Dermatological: Skin is warm, dry and supple bilateral. Nails x 10 are well manicured; remaining integument appears unremarkable at this time. There are no open sores, no preulcerative lesions, no rash or signs of infection present.  Vascular: Dorsalis Pedis artery and Posterior Tibial artery pedal pulses are 2/4 bilateral with immedate capillary fill time. There is no pain with calf compression, swelling, warmth, erythema.   Neruologic: Grossly intact via light touch bilateral. Vibratory intact via tuning fork bilateral. Protective threshold with Semmes Wienstein monofilament intact to all pedal sites bilateral. Negative tinel sign.   Musculoskeletal: Tenderness to palpation along the plantar medial tubercle of the calcaneus at the insertion of plantar fascia on the left foot. There is no pain along the course of the plantar fascia within the arch of the foot. Plantar fascia appears to be intact. There is no pain with lateral compression of the calcaneus or pain with vibratory sensation. There is no pain along the course or insertion of the achilles tendon. No other areas of tenderness to bilateral lower extremities.  Muscular strength 5/5 in all groups tested bilateral.  Gait: Unassisted,  Nonantalgic.      Assessment & Plan:  56 year old male with left heel  pain likely plantar fasciitis with small inferior calcaneal spurring -Treatment options discussed including all alternatives, risks, and complications -Etiology of symptoms were discussed -X-rays were reviewed with the patient -Patient elects to proceed with steroid injection into the left heel. Under sterile skin preparation, a total of 2.5cc of kenalog 10, 0.5% Marcaine plain, and 2% lidocaine plain were infiltrated into the symptomatic area without complication. A band-aid was applied. Patient tolerated the injection well without complication. Post-injection care with discussed with the patient. Discussed with the patient to ice the area over the next couple of days to help prevent a steroid flare.  -Plantar fascial brace -Stretching and icing daily -Discussed shoe modifications and orthotics -Follow-up in 3 weeks or sooner if any problems arise. In the meantime, encouraged to call the office with any questions, concerns, change in symptoms.   Ovid CurdMatthew Caroll Cunnington, DPM

## 2016-09-06 ENCOUNTER — Encounter: Payer: Self-pay | Admitting: Medical

## 2016-09-13 MED FILL — MELOXICAM 15 MG TABLET: 15 | 30 days supply | Qty: 30 | Fill #0

## 2016-09-18 ENCOUNTER — Ambulatory Visit: Payer: 59 | Admitting: Podiatry

## 2016-09-18 IMAGING — CT CT CHEST W/O CM
2 of 3 series · 15 of 36 positions shown, 18 images · non-contrast
Comparison: 05/22/2008

CLINICAL DATA: Followup pulmonary nodule found [DATE]. No current
chest complaints. History of smoking.

EXAM:
CT CHEST WITHOUT CONTRAST
TECHNIQUE: Multidetector CT imaging of the chest was performed following the
standard protocol without IV contrast.

[Series 2: chest 5.0 b31f · axial · 0.70mm/px · z∈[-186,+54]mm · 12 of 58 slices shown, 15 images]
[im 5/58  mediastinal]
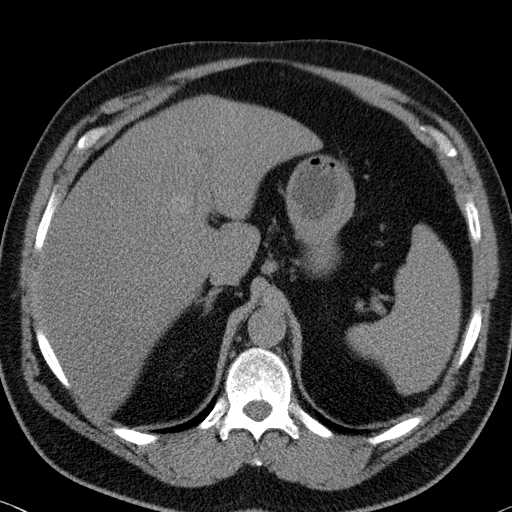
[im 5/58  lung]
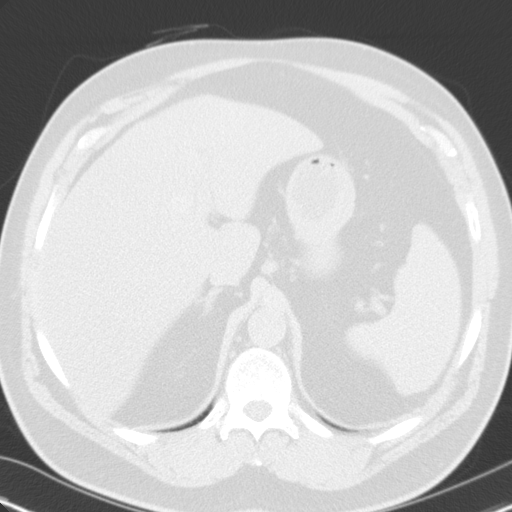
[im 9/58  lung]
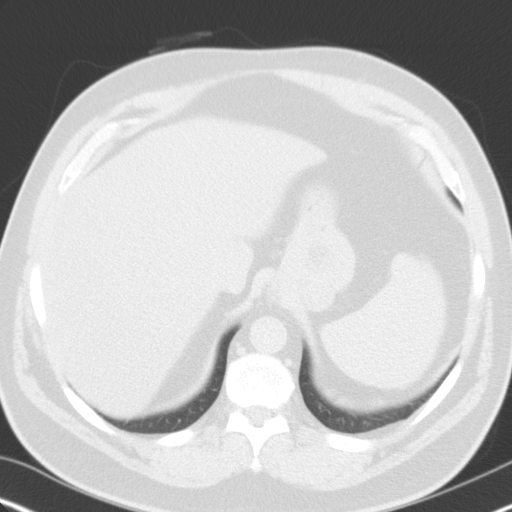
[im 13/58  lung]
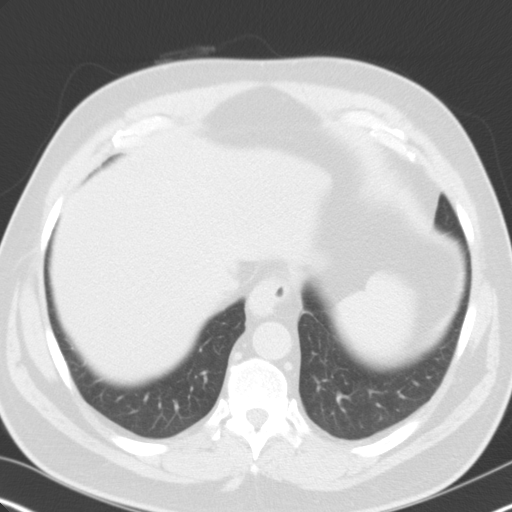
[im 17/58  lung]
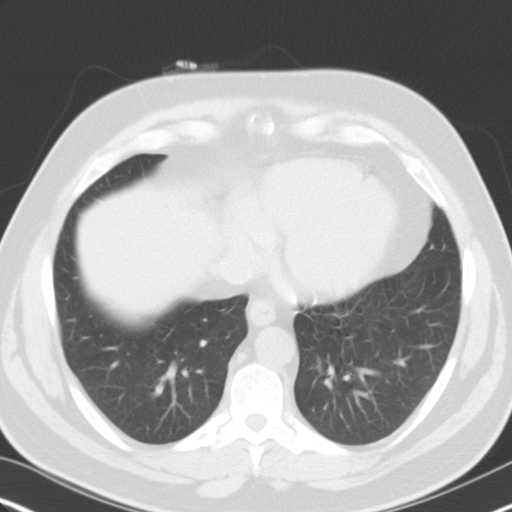
[im 22/58  mediastinal]
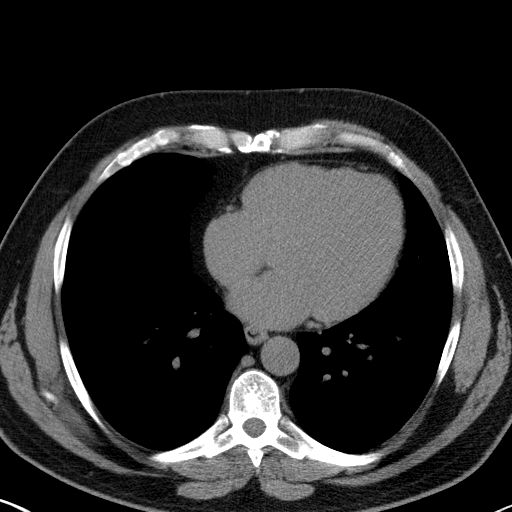
[im 22/58  lung]
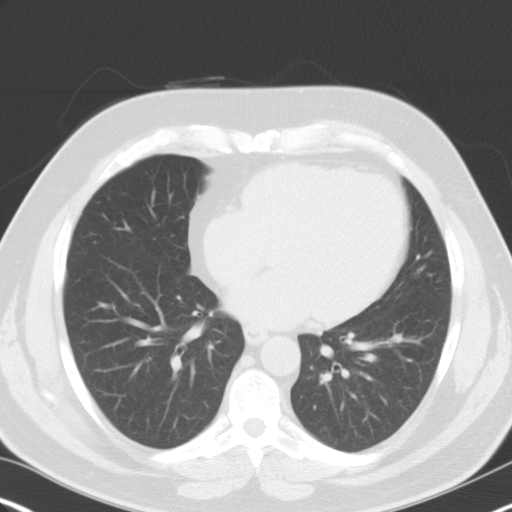
[im 26/58  lung]
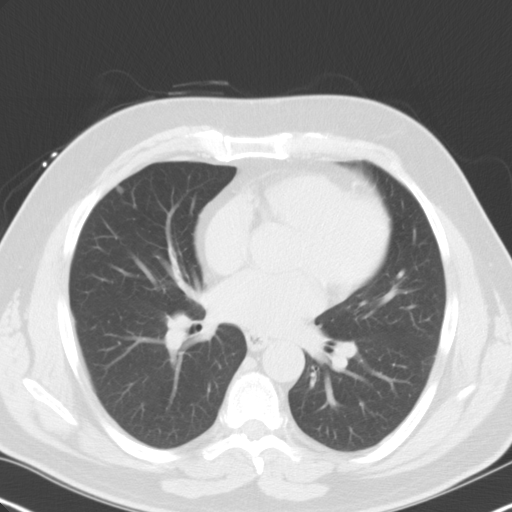
[im 32/58  lung]
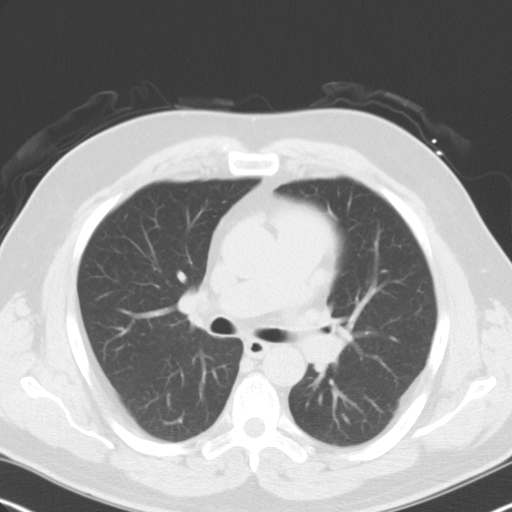
[im 36/58  lung]
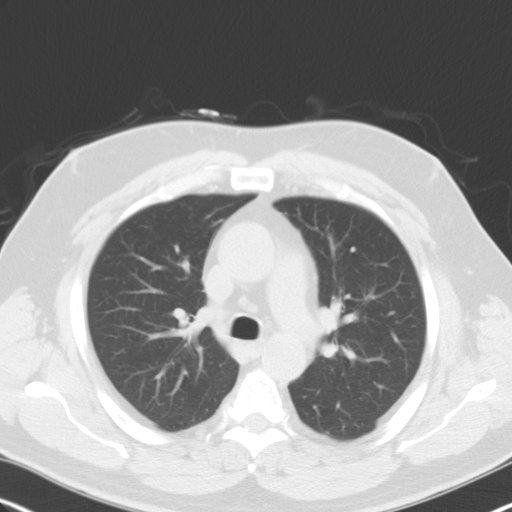
[im 41/58  mediastinal]
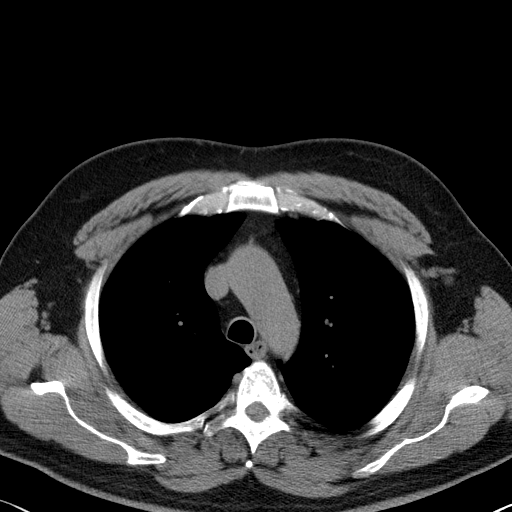
[im 41/58  lung]
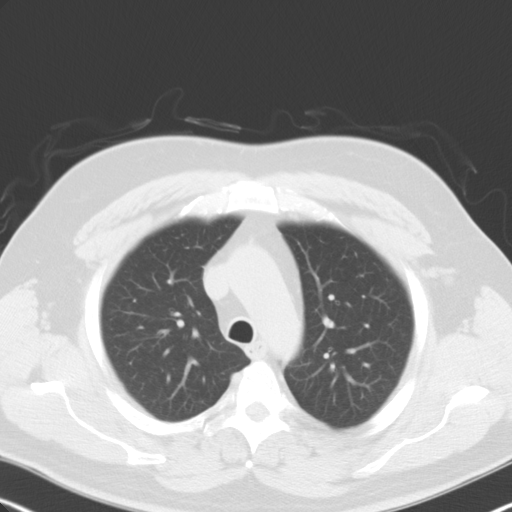
[im 45/58  lung]
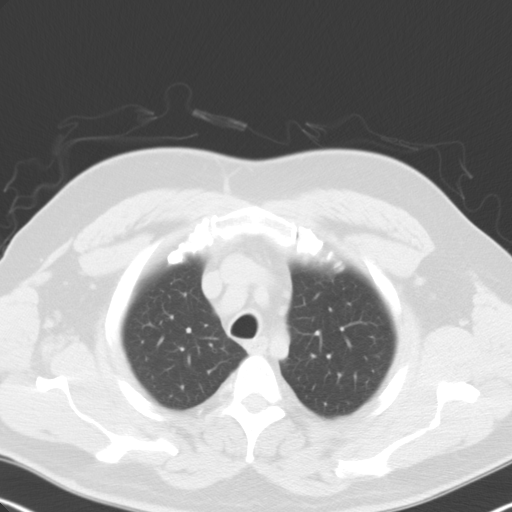
[im 49/58  lung]
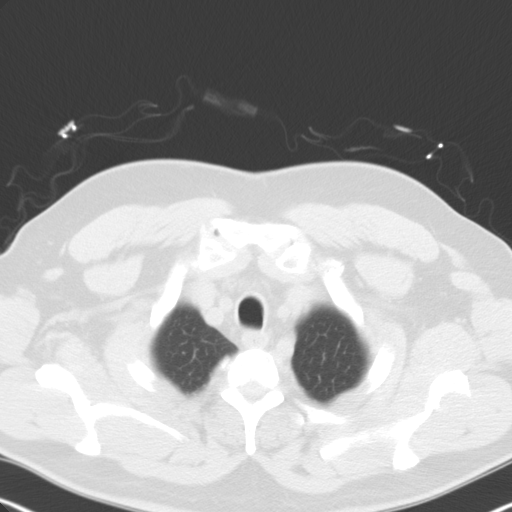
[im 53/58  lung]
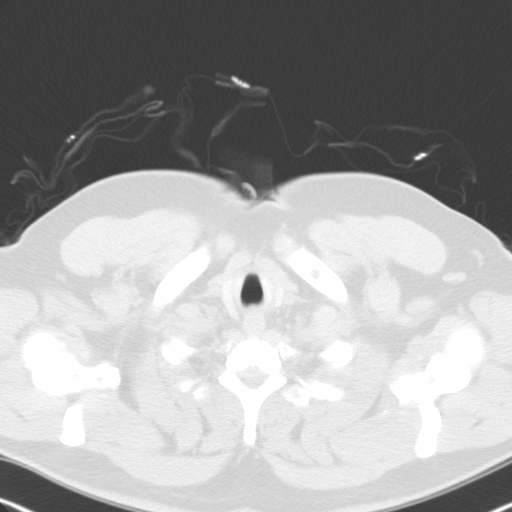

[Series 6: chest 3.0 coronal · coronal · 0.59mm/px · 3 of 101 slices shown]
[im 21/101  lung]
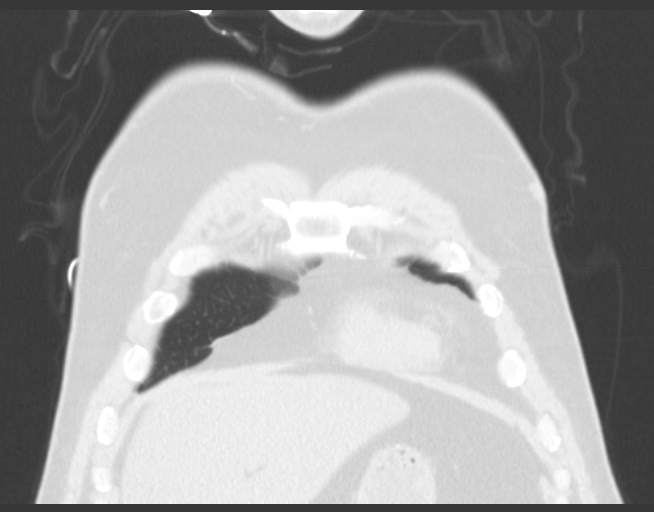
[im 41/101  lung]
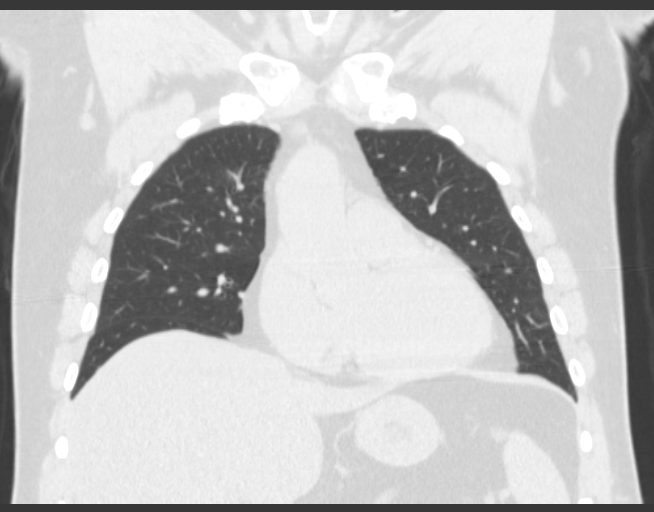
[im 61/101  lung]
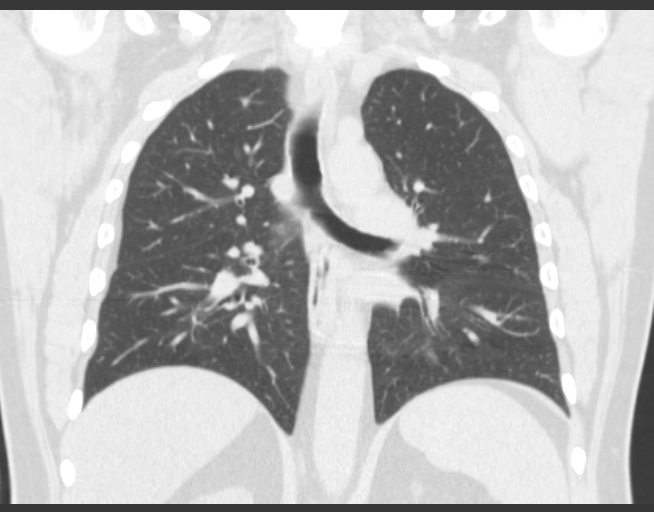

[15 of 36 positions shown; findings below may reference images not displayed]

FINDINGS: Thoracic inlet:  No mass or adenopathy.  Normal thyroid.

Mediastinum and hila: Heart normal in size. No coronary artery
calcifications. Mild prominence of the ascending aorta measuring
cm, stable. No masses or adenopathy.

Lungs and pleura: Stable 5 mm right middle lobe subpleural and
posterior left upper lobe subpleural nodules consistent with benign,
reactive, subpleural lymph nodes. No other lung nodules. No lung
consolidation or edema. No pleural effusion or pneumothorax.

Limited upper abdomen: Small hiatal hernia. Fatty infiltration of
the liver. Partly imaged well-defined hypo attenuating lesion in the
lateral right lobe measuring 2.4 cm Mild increased in size from the
prior study, but likely a cyst.

Musculoskeletal: Minor degenerative changes along the mid and lower
thoracic spine. Otherwise unremarkable.
IMPRESSION: 1. No acute findings.
2. Two subpleural nodules, 1 in the right middle lobe and the other
in the posterior left upper lobe, both measuring 5 mm. These have
been stable since the 1171 study and are benign, likely benign,
reactive subpleural lymph nodes. No other pulmonary nodules.
3. Hepatic steatosis. 2.4 cm liver lesion in the right lobe, mildly
increased in size from the prior study, but likely a cyst.

## 2016-11-19 ENCOUNTER — Ambulatory Visit: Payer: 59 | Admitting: Medical

## 2016-11-26 ENCOUNTER — Ambulatory Visit: Payer: 59 | Admitting: Medical

## 2016-11-28 ENCOUNTER — Ambulatory Visit (HOSPITAL_BASED_OUTPATIENT_CLINIC_OR_DEPARTMENT_OTHER)
Admission: RE | Admit: 2016-11-28 | Discharge: 2016-11-28 | Disposition: A | Discharge status: Home / Self Care | Payer: 59 | Source: Ambulatory Visit | Attending: Medical | Admitting: Medical

## 2016-11-28 ENCOUNTER — Encounter: Payer: Self-pay | Admitting: Medical

## 2016-11-28 ENCOUNTER — Ambulatory Visit: Payer: 59 | Admitting: Medical

## 2016-11-28 VITALS — BP 118/73 | HR 60 | Temp 98.1°F | Resp 16 | Wt 249.4 lb

## 2016-11-28 DIAGNOSIS — E785 Hyperlipidemia, unspecified: Secondary | ICD-10-CM | POA: Diagnosis not present

## 2016-11-28 DIAGNOSIS — M79673 Pain in unspecified foot: Secondary | ICD-10-CM | POA: Diagnosis not present

## 2016-11-28 DIAGNOSIS — M25511 Pain in right shoulder: Secondary | ICD-10-CM | POA: Diagnosis not present

## 2016-11-28 DIAGNOSIS — E118 Type 2 diabetes mellitus with unspecified complications: Secondary | ICD-10-CM

## 2016-11-28 DIAGNOSIS — Z23 Encounter for immunization: Secondary | ICD-10-CM

## 2016-11-28 LAB — COMPREHENSIVE METABOLIC PANEL
ALT: 33 U/L (ref 0–53)
AST: 21 U/L (ref 0–37)
Albumin: 4.5 g/dL (ref 3.5–5.2)
Alkaline Phosphatase: 50 U/L (ref 39–117)
BUN: 17 mg/dL (ref 6–23)
CALCIUM: 9.9 mg/dL (ref 8.4–10.5)
CHLORIDE: 102 meq/L (ref 96–112)
CO2: 31 meq/L (ref 19–32)
CREATININE: 1.02 mg/dL (ref 0.40–1.50)
GFR: 79.89 mL/min (ref >60.00)
Glucose, Bld: 132 mg/dL — ABNORMAL HIGH (ref 70–99)
Potassium: 4.4 mEq/L (ref 3.5–5.1)
SODIUM: 139 meq/L (ref 135–145)
Total Bilirubin: 0.6 mg/dL (ref 0.2–1.2)
Total Protein: 7.1 g/dL (ref 6.0–8.3)

## 2016-11-28 LAB — LIPID PANEL
CHOL/HDL RATIO: 3
CHOLESTEROL: 115 mg/dL (ref 0–200)
HDL: 41 mg/dL (ref >39.00)
LDL CALC: 43 mg/dL (ref 0–99)
NonHDL: 74.14
Triglycerides: 154 mg/dL — ABNORMAL HIGH (ref 0.0–149.0)
VLDL: 30.8 mg/dL (ref 0.0–40.0)

## 2016-11-28 LAB — HEMOGLOBIN A1C: Hgb A1c MFr Bld: 6.8 % — ABNORMAL HIGH (ref 4.6–6.5)

## 2016-11-28 NOTE — Patient Instructions (Signed)
For your heel spur and plantar fasciitis continue conservative measures.  For your diabetes, we will get hemoglobin A1c today.  Then decide if we need to make any changes to your current regimen.  Continue low sugar diet and try to do some exercise but does not flare of your plantar fasciitis.  For high cholesterol continue current medications but will get lipid panel today.  Also labs will include liver enzymes.  For your right shoulder pain, I put in an x-ray order to be done today.  You could use either Tylenol or ibuprofen over-the-counter for pain.  We will follow the x-ray and let you know the results.  Referral to physical therapy or sports medicine if the pain worsens.  Follow-up date to be determined after lab review.

## 2016-11-28 NOTE — Progress Notes (Signed)
Subjective:    Patient ID: Justin Richard, male    DOB: 1960-11-23, 56 y.o.   MRN: [id number]  HPI  Pt in for follow up.  Pt gives me update that left foot plantar fascitis has come back but he wants to just do conservative measures.  Pt also states for about 2  month rt shoulder region pain. Pain when lifts arm at time and when he lays on his rt side. Pain level waxes and wanes.  Pt had meloxicam in the past. He did not think this helped much.  Pt last a1-c was 7.3. Pt GFR was controlled in the past. Pt states he has been more disciplined on his diet. Pt is on metformin. He never got the glucose monitor.  Pt is exercising some.  Pt lipid panel looks good.      Review of Systems  Constitutional: Negative for chills, fatigue and fever.  Respiratory: Negative for chest tightness, shortness of breath and wheezing.   Gastrointestinal: Negative for abdominal pain, blood in stool, diarrhea, nausea and vomiting.  Musculoskeletal: Negative for back pain, myalgias and neck stiffness.  Skin: Negative for rash.  Neurological: Negative for dizziness, seizures, syncope, weakness and light-headedness.  Hematological: Negative for adenopathy. Does not bruise/bleed easily.  Psychiatric/Behavioral: Negative for behavioral problems, confusion, hallucinations, self-injury and suicidal ideas. The patient is not nervous/anxious.      Past Medical History:  Diagnosis Date  . Allergy   . Asthma   . Chicken pox   . GERD (gastroesophageal reflux disease)   . Hyperlipidemia      Social History   Socioeconomic History  . Marital status: Married    Spouse name: Not on file  . Number of children: Not on file  . Years of education: Not on file  . Highest education level: Not on file  Social Needs  . Financial resource strain: Not on file  . Food insecurity - worry: Not on file  . Food insecurity - inability: Not on file  . Transportation needs - medical: Not on file  . Transportation  needs - non-medical: Not on file  Occupational History  . Not on file  Tobacco Use  . Smoking status: Never Smoker  . Smokeless tobacco: Never Used  Substance and Sexual Activity  . Alcohol use: No    Alcohol/week: 0.0 oz  . Drug use: No  . Sexual activity: Not on file  Other Topics Concern  . Not on file  Social History Narrative  . Not on file    Past Surgical History:  Procedure Laterality Date  . BRONCHOSCOPY      Family History  Problem Relation Age of Onset  . Arthritis Mother   . Cancer Mother   . Hyperlipidemia Mother   . Diabetes Mother   . Arthritis Father   . Cancer Father   . Hyperlipidemia Father   . Diabetes Father   . Cancer Maternal Grandmother   . Diabetes Maternal Grandmother   . Cancer Maternal Grandfather   . Diabetes Maternal Grandfather   . Cancer Paternal Grandmother   . Diabetes Paternal Grandmother   . Cancer Paternal Grandfather   . Diabetes Paternal Grandfather     Allergies  Allergen Reactions  . Codeine Nausea Only    Current Outpatient Medications on File Prior to Visit  Medication Sig Dispense Refill  . albuterol (PROVENTIL HFA;VENTOLIN HFA) 108 (90 Base) MCG/ACT inhaler Inhale 2 puffs into the lungs every 6 (six) hours as needed for wheezing or  shortness of breath. 1 Inhaler 0  . benzonatate (TESSALON) 100 MG capsule Take 1 capsule (100 mg total) by mouth 3 (three) times daily as needed for cough. 21 capsule 0  . doxycycline (VIBRA-TABS) 100 MG tablet Take 1 tablet (100 mg total) by mouth 2 (two) times daily. Can give caps or generic 20 tablet 0  . fenofibrate (TRICOR) 145 MG tablet Take 1 tablet (145 mg total) by mouth daily. 90 tablet 1  . fluticasone (FLONASE) 50 MCG/ACT nasal spray Place 2 sprays into both nostrils daily. 16 g 1  . meloxicam (MOBIC) 15 MG tablet Take 1 tablet (15 mg total) by mouth daily. 30 tablet 2  . metFORMIN (GLUCOPHAGE-XR) 500 MG 24 hr tablet 2 tab po q day 180 tablet 1  . rosuvastatin (CRESTOR) 20 MG  tablet Take 1 tablet (20 mg total) by mouth daily. 90 tablet 1   No current facility-administered medications on file prior to visit.     BP 118/73 (BP Location: Right Arm, Patient Position: Sitting, Cuff Size: Large)   Pulse 60   Temp 98.1 F (36.7 C) (Oral)   Resp 16   Wt 249 lb 6.4 oz (113.1 kg)   SpO2 96%   BMI 33.82 kg/m       Objective:   Physical Exam  General Mental Status- Alert. General Appearance- Not in acute distress.   Skin General: Color- Normal Color. Moisture- Normal Moisture.  Neck Carotid Arteries- Normal color. Moisture- Normal Moisture. No carotid bruits. No JVD.  Chest and Lung Exam Auscultation: Breath Sounds:-Normal.  Cardiovascular Auscultation:Rythm- Regular. Murmurs & Other Heart Sounds:Auscultation of the heart reveals- No Murmurs.  Abdomen Inspection:-Inspeection Normal. Palpation/Percussion:Note:No mass. Palpation and Percussion of the abdomen reveal- Non Tender, Non Distended + BS, no rebound or guarding.   Neurologic Cranial Nerve exam:- CN III-XII intact(No nystagmus), symmetric smile. Strength:- 5/5 equal and symmetric strength both upper and lower extremities.  Lower ext-  See quality.  Rt shoulder- pain on range of motion. More in rt trapezius area.Some on palpation. No direct pain on shoulder palpation.      Assessment & Plan:  For your heel spur and plantar fasciitis continue conservative measures.  For your diabetes, we will get hemoglobin A1c today.  Then decide if we need to make any changes to your current regimen.  Continue low sugar diet and try to do some exercise but does not flare of your plantar fasciitis.  For high cholesterol continue current medications but will get lipid panel today.  Also labs will include liver enzymes.  For your right shoulder pain, I put in an x-ray order to be done today.  You could use either Tylenol or ibuprofen over-the-counter for pain.  We will follow the x-ray and let you know  the results.  Referral to physical therapy or sports medicine if the pain worsens.  Follow-up date to be determined after lab review.  Rose-Marie Hickling, Ramon DredgeEdward, PA-C

## 2016-11-29 ENCOUNTER — Telehealth: Payer: Self-pay | Admitting: Medical

## 2016-11-29 NOTE — Telephone Encounter (Signed)
Pt return call for results.    pls call back.

## 2016-12-27 ENCOUNTER — Other Ambulatory Visit: Payer: Self-pay | Admitting: Medical

## 2017-01-12 ENCOUNTER — Other Ambulatory Visit: Payer: Self-pay | Admitting: Medical

## 2017-03-25 ENCOUNTER — Telehealth: Payer: Self-pay | Admitting: Medical

## 2017-03-25 NOTE — Telephone Encounter (Signed)
Copied from CRM [id number]. Topic: Quick Communication - Rx Refill/Question >> Mar 25, 2017 12:20 PM Arlyss Gandyichardson, Evoleht Hovatter N, NT wrote: Medication:  fenofibrate Rosalio Loud(TRICOR)    Has the patient contacted their pharmacy? Yes.     (Agent: If no, request that the patient contact the pharmacy for the refill.)   Preferred Pharmacy (with phone number or street name): [facility]. Pharmacy wants to see if this medication can be increased from 145 to 150 to meet their formularies.   Agent: Please be advised that RX refills may take up to 3 business days. We ask that you follow-up with your pharmacy.

## 2017-03-26 MED ORDER — FENOFIBRATE 160 MG PO TABS: 160.0000 mg | ORAL_TABLET | Freq: Every day | ORAL | 3 refills | Status: DC

## 2017-03-26 NOTE — Telephone Encounter (Signed)
Ref# [id number].  Spoke with [facility]. Last fenofibrate Rx was for 145mg . CSR states 145mg  is no longer covered by insurance. They weill cover 160mg  and would be more affordable for pt. Please advise if ok to increase to 160mg ?

## 2017-03-26 NOTE — Telephone Encounter (Signed)
Notified pt. 

## 2017-03-26 NOTE — Telephone Encounter (Signed)
Sent 160 mg dose of [facility]ate [facility]

## 2017-06-19 ENCOUNTER — Other Ambulatory Visit: Payer: Self-pay | Admitting: Medical

## 2017-06-20 NOTE — Telephone Encounter (Signed)
Pt is due for follow up please call and schedule appointment.  

## 2017-06-25 NOTE — Telephone Encounter (Signed)
LVM for pt to call and schedule a FU appt.  °

## 2017-07-24 ENCOUNTER — Ambulatory Visit: Payer: 59 | Admitting: Medical

## 2017-07-25 ENCOUNTER — Ambulatory Visit: Payer: 59 | Admitting: Medical

## 2017-07-25 ENCOUNTER — Telehealth: Payer: Self-pay | Admitting: Medical

## 2017-07-25 NOTE — Telephone Encounter (Signed)
Reviewed his no show status. No one asked me today about this patient being late and would I see? So asked Selena BattenKim to call him and get him rescheduled.

## 2017-07-25 NOTE — Telephone Encounter (Signed)
I called pt(had to leave message) to inform him I was not notfied that he showed up a little late and that I would have seen him had I been notified. Apologized and explained Selena BattenKim had called to reach out to him to get rescheduled. Did mention we have time frame that is considered late but had I been notified he was a little over that I still would have seen him. He was 15 minutes past time off his appointment when [facility] to res[facility].

## 2017-07-25 NOTE — Telephone Encounter (Signed)
Opened to review 

## 2017-08-19 ENCOUNTER — Telehealth: Payer: Self-pay | Admitting: Medical

## 2017-08-19 NOTE — Telephone Encounter (Signed)
Copied from CRM #[id number]. Topic: Quick Communication - Rx Refill/Question >> Aug 19, 2017  4:25 PM Rica KoyanagiWeikart, Barbee CoughMelissa J wrote: Medication:  metFORMIN (GLUCOPHAGE-XR) 500 MG 24 hr tablet Pt says he takes 1 per day qty 90  Has the patient contacted their pharmacy? Yes.   (Agent: If no, request that the patient contact the pharmacy for the refill.) (Agent: If yes, when and what did the pharmacy advise?)  Preferred Pharmacy (with phone number or street name): [facility]   Agent: Please be advised that RX refills may take up to 3 business days. We ask that you follow-up with your pharmacy.

## 2017-08-19 NOTE — Telephone Encounter (Signed)
Metformin 500mg  24hr tab refill. Pt states he takes one tab per day and is requesting a 90 day supply of the medication. Current prescription instructions state for the pt to take 2 -500 mg tabs daily Last Refill:06/20/17 #180 tab with 1 refill Last OV: 11/28/16 PCP: Edwena BundeEdward Saguier,PA Pharmacy:[facility]

## 2017-08-20 NOTE — Telephone Encounter (Signed)
Continue current meds 

## 2017-08-22 NOTE — Telephone Encounter (Signed)
Left pt a message to call back. 

## 2017-08-26 ENCOUNTER — Encounter: Payer: Self-pay | Admitting: Medical

## 2017-08-26 ENCOUNTER — Ambulatory Visit: Payer: 59 | Admitting: Medical

## 2017-08-26 ENCOUNTER — Telehealth: Payer: Self-pay | Admitting: Medical

## 2017-08-26 VITALS — BP 130/76 | HR 54 | Temp 97.9°F | Resp 16 | Ht 72.0 in | Wt 245.0 lb

## 2017-08-26 DIAGNOSIS — R350 Frequency of micturition: Secondary | ICD-10-CM

## 2017-08-26 DIAGNOSIS — E785 Hyperlipidemia, unspecified: Secondary | ICD-10-CM

## 2017-08-26 DIAGNOSIS — E118 Type 2 diabetes mellitus with unspecified complications: Secondary | ICD-10-CM

## 2017-08-26 DIAGNOSIS — Z125 Encounter for screening for malignant neoplasm of prostate: Secondary | ICD-10-CM | POA: Diagnosis not present

## 2017-08-26 LAB — LIPID PANEL
CHOL/HDL RATIO: 3
CHOLESTEROL: 123 mg/dL (ref 0–200)
HDL: 38.3 mg/dL — AB (ref >39.00)
NonHDL: 84.84
Triglycerides: 248 mg/dL — ABNORMAL HIGH (ref 0.0–149.0)
VLDL: 49.6 mg/dL — AB (ref 0.0–40.0)

## 2017-08-26 LAB — POC URINALSYSI DIPSTICK (AUTOMATED)
BILIRUBIN UA: NEGATIVE
Blood, UA: NEGATIVE
Glucose, UA: POSITIVE — AB
KETONES UA: NEGATIVE
LEUKOCYTES UA: NEGATIVE
Nitrite, UA: NEGATIVE
Protein, UA: NEGATIVE
Urobilinogen, UA: NEGATIVE E.U./dL — AB
pH, UA: 6 (ref 5.0–8.0)

## 2017-08-26 LAB — COMPREHENSIVE METABOLIC PANEL
ALT: 33 U/L (ref 0–53)
AST: 17 U/L (ref 0–37)
Albumin: 4.4 g/dL (ref 3.5–5.2)
Alkaline Phosphatase: 65 U/L (ref 39–117)
BUN: 18 mg/dL (ref 6–23)
CHLORIDE: 102 meq/L (ref 96–112)
CO2: 32 mEq/L (ref 19–32)
Calcium: 10.2 mg/dL (ref 8.4–10.5)
Creatinine, Ser: 1.06 mg/dL (ref 0.40–1.50)
GFR: 76.22 mL/min (ref >60.00)
GLUCOSE: 277 mg/dL — AB (ref 70–99)
POTASSIUM: 4.3 meq/L (ref 3.5–5.1)
SODIUM: 139 meq/L (ref 135–145)
Total Bilirubin: 0.6 mg/dL (ref 0.2–1.2)
Total Protein: 6.8 g/dL (ref 6.0–8.3)

## 2017-08-26 LAB — PSA: PSA: 0.72 ng/mL (ref 0.10–4.00)

## 2017-08-26 LAB — LDL CHOLESTEROL, DIRECT: Direct LDL: 55 mg/dL

## 2017-08-26 MED ORDER — METFORMIN HCL ER 500 MG PO TB24: ORAL_TABLET | ORAL | 0 refills | Status: DC

## 2017-08-26 MED FILL — METFORMIN HCL ER 500 MG TAB: 500 | 7 days supply | Qty: 14 | Fill #0

## 2017-08-26 NOTE — Patient Instructions (Addendum)
Pt diabetes recommend stricter diet and exercise. Will get a1c today. Make modifications on meds if necessary.  For high cholesterol low cholesterol diet and exercise. May make modifications on meds if necessary.  For frequent urination will check ua and psa.  Follow up in mid november or as needed

## 2017-08-26 NOTE — Telephone Encounter (Signed)
I accidentally forgot to order a1-c. I am asking lab if they can add a1c. Will you ask Wilkie AyeKristy or Marylene Landngela if it can be added. If not will you ask pt to come back in and coordinate the lab appointment.   Please express my apologies if he hast come back.

## 2017-08-26 NOTE — Progress Notes (Signed)
Subjective:    Patient ID: Justin Richard, male    DOB: September 10, 1960, 57 y.o.   MRN: [id number]  HPI  Pt in for follow up.  Pt states he thinks sugar average might be up since he has been on vacation. Some occasional episodes of frequent urination and dry mouth past 2 weeks. Pt did not take metformin today. He has not been exercising regularly but about to join a gym.  He has high cholesterol. He is on crestor. No cardiac or neurologic signs or symptoms.   Regarding frequent urination, pt reports  no fever, no chills or sweats. On review dad had history of prostate cancer in late 4770's.    Review of Systems  Constitutional: Negative for chills, fatigue and fever.  HENT: Negative for congestion, drooling, ear pain, facial swelling, sinus pain, sore throat and trouble swallowing.   Respiratory: Negative for chest tightness, shortness of breath and stridor.   Cardiovascular: Negative for chest pain and palpitations.  Gastrointestinal: Negative for abdominal distention, abdominal pain, nausea and rectal pain.  Endocrine: Positive for polyuria.  Genitourinary: Positive for frequency.  Musculoskeletal: Negative for back pain.  Skin: Negative for rash.  Neurological: Negative for dizziness, tremors, speech difficulty, weakness, light-headedness and headaches.  Hematological: Negative for adenopathy. Does not bruise/bleed easily.  Psychiatric/Behavioral: Negative for behavioral problems, confusion, dysphoric mood and sleep disturbance. The patient is not nervous/anxious.     Past Medical History:  Diagnosis Date  . Allergy   . Asthma   . Chicken pox   . GERD (gastroesophageal reflux disease)   . Hyperlipidemia      Social History   Socioeconomic History  . Marital status: Married    Spouse name: Not on file  . Number of children: Not on file  . Years of education: Not on file  . Highest education level: Not on file  Occupational History  . Not on file  Social Needs  .  Financial resource strain: Not on file  . Food insecurity:    Worry: Not on file    Inability: Not on file  . Transportation needs:    Medical: Not on file    Non-medical: Not on file  Tobacco Use  . Smoking status: Never Smoker  . Smokeless tobacco: Never Used  Substance and Sexual Activity  . Alcohol use: No    Alcohol/week: 0.0 standard drinks  . Drug use: No  . Sexual activity: Not on file  Lifestyle  . Physical activity:    Days per week: Not on file    Minutes per session: Not on file  . Stress: Not on file  Relationships  . Social connections:    Talks on phone: Not on file    Gets together: Not on file    Attends religious service: Not on file    Active member of club or organization: Not on file    Attends meetings of clubs or organizations: Not on file    Relationship status: Not on file  . Intimate partner violence:    Fear of current or ex partner: Not on file    Emotionally abused: Not on file    Physically abused: Not on file    Forced sexual activity: Not on file  Other Topics Concern  . Not on file  Social History Narrative  . Not on file    Past Surgical History:  Procedure Laterality Date  . BRONCHOSCOPY      Family History  Problem Relation Age of  Onset  . Arthritis Mother   . Cancer Mother   . Hyperlipidemia Mother   . Diabetes Mother   . Arthritis Father   . Cancer Father   . Hyperlipidemia Father   . Diabetes Father   . Cancer Maternal Grandmother   . Diabetes Maternal Grandmother   . Cancer Maternal Grandfather   . Diabetes Maternal Grandfather   . Cancer Paternal Grandmother   . Diabetes Paternal Grandmother   . Cancer Paternal Grandfather   . Diabetes Paternal Grandfather     Allergies  Allergen Reactions  . Codeine Nausea Only    Current Outpatient Medications on File Prior to Visit  Medication Sig Dispense Refill  . albuterol (PROVENTIL HFA;VENTOLIN HFA) 108 (90 Base) MCG/ACT inhaler Inhale 2 puffs into the lungs every  6 (six) hours as needed for wheezing or shortness of breath. 1 Inhaler 0  . benzonatate (TESSALON) 100 MG capsule Take 1 capsule (100 mg total) by mouth 3 (three) times daily as needed for cough. 21 capsule 0  . doxycycline (VIBRA-TABS) 100 MG tablet Take 1 tablet (100 mg total) by mouth 2 (two) times daily. Can give caps or generic 20 tablet 0  . fenofibrate 160 MG tablet Take 1 tablet (160 mg total) by mouth daily. 90 tablet 3  . fluticasone (FLONASE) 50 MCG/ACT nasal spray Place 2 sprays into both nostrils daily. 16 g 1  . metFORMIN (GLUCOPHAGE-XR) 500 MG 24 hr tablet TAKE 2 TABLETS BY MOUTH  EVERY DAY 180 tablet 1  . rosuvastatin (CRESTOR) 20 MG tablet TAKE 1 TABLET BY MOUTH  DAILY 90 tablet 1   No current facility-administered medications on file prior to visit.     BP 130/76   Pulse (!) 54   Temp 97.9 F (36.6 C) (Oral)   Resp 16   Ht 6' (1.829 m)   Wt 245 lb (111.1 kg)   SpO2 98%   BMI 33.23 kg/m       Objective:   Physical Exam  General Mental Status- Alert. General Appearance- Not in acute distress.   Skin General: Color- Normal Color. Moisture- Normal Moisture.  Neck Carotid Arteries- Normal color. Moisture- Normal Moisture. No carotid bruits. No JVD.  Chest and Lung Exam Auscultation: Breath Sounds:-Normal.  Cardiovascular Auscultation:Rythm- Regular. Murmurs & Other Heart Sounds:Auscultation of the heart reveals- No Murmurs.  Abdomen Inspection:-Inspeection Normal. Palpation/Percussion:Note:No mass. Palpation and Percussion of the abdomen reveal- Non Tender, Non Distended + BS, no rebound or guarding.    Neurologic Cranial Nerve exam:- CN III-XII intact(No nystagmus), symmetric smile. Strength:- 5/5 equal and symmetric strength both upper and lower extremities.  Lower ext- see quality metrics.     Assessment & Plan:  Pt diabetes recommend stricter diet and exercise. Will get a1c today. Make modifications on meds if necessary.  For high  cholesterol low cholesterol diet and exercise. May make modifications on meds if necessary.  For frequent urination will check ua and psa.  Follow up in mid November or as needed  [employer] FoodsEdward Kelcey Korus, PA-C

## 2017-08-27 NOTE — Telephone Encounter (Signed)
Can not add A1c, there was no CBC ordered and that's the tube A1c has to be added to.

## 2017-08-27 NOTE — Telephone Encounter (Signed)
Left pt a message to call and schedule lab appointment to get a1c done

## 2017-08-28 ENCOUNTER — Other Ambulatory Visit (INDEPENDENT_AMBULATORY_CARE_PROVIDER_SITE_OTHER): Payer: 59

## 2017-08-28 DIAGNOSIS — E118 Type 2 diabetes mellitus with unspecified complications: Secondary | ICD-10-CM | POA: Diagnosis not present

## 2017-08-28 LAB — HEMOGLOBIN A1C: Hgb A1c MFr Bld: 10.5 % — ABNORMAL HIGH (ref 4.6–6.5)

## 2017-08-29 ENCOUNTER — Telehealth: Payer: Self-pay | Admitting: Medical

## 2017-08-29 MED ORDER — METFORMIN HCL ER 500 MG PO TB24: ORAL_TABLET | ORAL | 3 refills | Status: DC

## 2017-08-29 NOTE — Telephone Encounter (Signed)
Prescribed higher dose metformin sent to pt pharmacy.

## 2017-08-30 ENCOUNTER — Telehealth: Payer: Self-pay | Admitting: Medical

## 2017-08-30 MED ORDER — METFORMIN HCL ER 500 MG PO TB24: ORAL_TABLET | ORAL | 0 refills | Status: AC

## 2017-08-30 NOTE — Telephone Encounter (Signed)
Copied from CRM [id number]. Topic: Quick Communication - Rx Refill/Question >> Aug 30, 2017  1:15 PM Crist InfanteHarrald, Kathy J wrote: Medication: metFORMIN (GLUCOPHAGE-XR) 500 MG 24 hr tablet Pt states Edward doubled his med, and the mail order messed up his order up the order.  Pt will be short 3 days until his rx comes in. Requesting 16 tabs (4 a day) to get him through to Monday when his Rx is due to arrive  [facility] [place] - [id number] [phone] (Phone) [phone] (Fax)

## 2017-08-30 NOTE — Telephone Encounter (Signed)
Requesting limited supply (#16) of Metformin 500 mg 24 hr. Tablet, until mail order arrives.  Last OV:  08/26/17 PCP: Justin Richard Metformin dose increased to 2000 mg (4 tabs) / day.    Sending order to local pharmacy for limited supply per patient request.

## 2017-09-02 ENCOUNTER — Telehealth: Payer: Self-pay | Admitting: Medical

## 2017-09-02 MED ORDER — GLUCOSE BLOOD VI STRP: ORAL_STRIP | 12 refills | Status: DC

## 2017-09-02 NOTE — Telephone Encounter (Signed)
Strips sent to pharmacy

## 2017-09-02 NOTE — Telephone Encounter (Signed)
Copied from CRM [phone]. Topic: General - Other >> Sep 02, 2017 11:24 AM Stephannie LiSimmons, Alvenia Treese L, NT wrote: Reason for CRM: Patiens wife called and said their insurance will pay for one touch test strips or contour next strips ,please call in  to [facility] [place] - [id number] [phone] (Phone) [phone] (Fax and they  will also need a machine and all supplies

## 2017-09-10 ENCOUNTER — Telehealth: Payer: Self-pay | Admitting: Medical

## 2017-09-10 MED ORDER — GLUCOSE BLOOD VI STRP: ORAL_STRIP | 1 refills | Status: AC

## 2017-09-10 MED ORDER — ONETOUCH ULTRA 2 W/DEVICE KIT: PACK | 0 refills | Status: AC

## 2017-09-10 MED ORDER — ONETOUCH ULTRASOFT LANCETS MISC: 1 refills | Status: AC

## 2017-09-10 NOTE — Telephone Encounter (Signed)
Per 09/02/17 phone note, pt's plan will cover onetouch or contour next test strips. Previous note was requesting that meter and [facility]. Per pharamcist at [facility], supplies should be covered at the local pharmacy. Rxs sent per 09/02/17 phone note.

## 2017-09-10 NOTE — Telephone Encounter (Signed)
Copied from CRM [id number]. Topic: Quick Communication - See Telephone Encounter >> Sep 10, 2017  2:28 PM Lorrine KinMcGee, Ivry Pigue B, NT wrote: CRM for notification. See Telephone encounter for: 09/10/17. Mark with [facility] calling and is needing clarification on what brand of glucose blood test strips and how often the patient is to check sugars. States that they are needing this clarification within 24 hours. Please advise. Possible to do 90day supply? CB#: [phone] ref#: [id number]

## 2017-09-11 ENCOUNTER — Telehealth: Payer: Self-pay

## 2017-09-11 MED FILL — ONETOUCH ULTRA 2 W/DEVICE K: W/DEVICE | 1 days supply | Qty: 1 | Fill #0

## 2017-09-11 MED FILL — ONE TOUCH ULTRASOFT LANCETS: 50 days supply | Qty: 100 | Fill #0

## 2017-09-11 MED FILL — ONE TOUCH ULTRA TEST STRIPS: 75 days supply | Qty: 150 | Fill #0

## 2017-09-11 NOTE — Telephone Encounter (Signed)
Author phoned pt. to clarify where he wanted diabetic supplies sent to in light of [facility] fax request. Pt. Stated he is getting his first supply through [facility], and then all other refills sent via [facility]. Clarification of kind of test strip signed off by author, and left at Wells FargoEdward's desk to sign for faxing. Jasmine, Edward's CMA, made aware.

## 2017-11-07 ENCOUNTER — Other Ambulatory Visit: Payer: Self-pay | Admitting: Medical

## 2017-11-26 ENCOUNTER — Ambulatory Visit: Payer: 59 | Admitting: Medical

## 2017-11-26 ENCOUNTER — Encounter: Payer: Self-pay | Admitting: Medical

## 2017-11-26 VITALS — BP 111/63 | HR 51 | Temp 98.2°F | Resp 16 | Ht 72.0 in | Wt 242.0 lb

## 2017-11-26 DIAGNOSIS — E118 Type 2 diabetes mellitus with unspecified complications: Secondary | ICD-10-CM

## 2017-11-26 DIAGNOSIS — Z23 Encounter for immunization: Secondary | ICD-10-CM | POA: Diagnosis not present

## 2017-11-26 DIAGNOSIS — E785 Hyperlipidemia, unspecified: Secondary | ICD-10-CM | POA: Diagnosis not present

## 2017-11-26 NOTE — Addendum Note (Signed)
Addended by: Steve RattlerBLEVINS, Lorrie Gargan A on: 11/26/2017 08:43 AM   Modules accepted: Orders

## 2017-11-26 NOTE — Progress Notes (Signed)
Subjective:    Patient ID: Justin Richard, male    DOB: [birth year], 57 y.o.   MRN: [id number]  HPI  Pt in for follow up.  Last a1c was 10.5. Pt doubled up on metformin. Pt states his fasting bs reading 130-140 fasting. Recently about 1 hour after eating sugars are 140. Pt is getting more exercise overall. Walking and doing some recumbant bike.  Pt states his weight at home was 237 lb.   Pt has high cholesterol history. He is on crestor and fenofibrate. Last time numbers were up after vacation.  Will get flu vaccine today.    Review of Systems  Constitutional: Negative for chills, fatigue and fever.  HENT: Negative for congestion, ear pain, nosebleeds, postnasal drip and rhinorrhea.   Respiratory: Negative for cough, choking, shortness of breath and wheezing.   Cardiovascular: Negative for palpitations.  Gastrointestinal: Negative for abdominal distention, abdominal pain, constipation and nausea.  Musculoskeletal: Negative for back pain, myalgias, neck pain and neck stiffness.  Skin: Negative for rash.  Hematological: Negative for adenopathy. Does not bruise/bleed easily.  Psychiatric/Behavioral: Negative for agitation, behavioral problems, confusion and suicidal ideas. The patient is not nervous/anxious.     Past Medical History:  Diagnosis Date  . Allergy   . Asthma   . Chicken pox   . GERD (gastroesophageal reflux disease)   . Hyperlipidemia      Social History   Socioeconomic History  . Marital status: Married    Spouse name: Not on file  . Number of children: Not on file  . Years of education: Not on file  . Highest education level: Not on file  Occupational History  . Not on file  Social Needs  . Financial resource strain: Not on file  . Food insecurity:    Worry: Not on file    Inability: Not on file  . Transportation needs:    Medical: Not on file    Non-medical: Not on file  Tobacco Use  . Smoking status: Never Smoker  . Smokeless tobacco: Never  Used  Substance and Sexual Activity  . Alcohol use: No    Alcohol/week: 0.0 standard drinks  . Drug use: No  . Sexual activity: Not on file  Lifestyle  . Physical activity:    Days per week: Not on file    Minutes per session: Not on file  . Stress: Not on file  Relationships  . Social connections:    Talks on phone: Not on file    Gets together: Not on file    Attends religious service: Not on file    Active member of club or organization: Not on file    Attends meetings of clubs or organizations: Not on file    Relationship status: Not on file  . Intimate partner violence:    Fear of current or ex partner: Not on file    Emotionally abused: Not on file    Physically abused: Not on file    Forced sexual activity: Not on file  Other Topics Concern  . Not on file  Social History Narrative  . Not on file    Past Surgical History:  Procedure Laterality Date  . BRONCHOSCOPY      Family History  Problem Relation Age of Onset  . Arthritis Mother   . Cancer Mother   . Hyperlipidemia Mother   . Diabetes Mother   . Arthritis Father   . Cancer Father   . Hyperlipidemia Father   .  Diabetes Father   . Cancer Maternal Grandmother   . Diabetes Maternal Grandmother   . Cancer Maternal Grandfather   . Diabetes Maternal Grandfather   . Cancer Paternal Grandmother   . Diabetes Paternal Grandmother   . Cancer Paternal Grandfather   . Diabetes Paternal Grandfather     Allergies  Allergen Reactions  . Codeine Nausea Only    Current Outpatient Medications on File Prior to Visit  Medication Sig Dispense Refill  . Blood Glucose Monitoring Suppl (ONE TOUCH ULTRA 2) w/Device KIT Use to check blood sugar 1 - 2 times daily 1 each 0  . fenofibrate 160 MG tablet Take 1 tablet (160 mg total) by mouth daily. 90 tablet 3  . glucose blood (ONE TOUCH ULTRA TEST) test strip Use as instructed to check blood sugar 1 - 2 times daily 180 each 1  . Lancets (ONETOUCH ULTRASOFT) lancets Use as  instructed to check blood sugar 1 - 2 times daily 100 each 1  . metFORMIN (GLUCOPHAGE-XR) 500 MG 24 hr tablet 4 tab po q day 360 tablet 3  . metFORMIN (GLUCOPHAGE-XR) 500 MG 24 hr tablet Take 4 tablets by mouth daily 16 tablet 0  . rosuvastatin (CRESTOR) 20 MG tablet TAKE 1 TABLET BY MOUTH  DAILY 90 tablet 1   No current facility-administered medications on file prior to visit.     There were no vitals taken for this visit.      Objective:   Physical Exam  General Mental Status- Alert. General Appearance- Not in acute distress.   Skin General: Color- Normal Color. Moisture- Normal Moisture.  Neck Carotid Arteries- Normal color. Moisture- Normal Moisture. No carotid bruits. No JVD.  Chest and Lung Exam Auscultation: Breath Sounds:-Normal.  Cardiovascular Auscultation:Rythm- Regular. Murmurs & Other Heart Sounds:Auscultation of the heart reveals- No Murmurs.  Abdomen Inspection:-Inspeection Normal. Palpation/Percussion:Note:No mass. Palpation and Percussion of the abdomen reveal- Non Tender, Non Distended + BS, no rebound or guarding.   Neurologic Cranial Nerve exam:- CN III-XII intact(No nystagmus), symmetric smile. Strength:- 5/5 equal and symmetric strength both upper and lower extremities.  Feet- see quality metrics.    Assessment & Plan:  For your diabetes will place future order a1c in and can get test done on Friday. Please get scheduled. Hopefully sugars are much better. Continue low sugar diet and exercise.  For high cholesterol will get cmp and lipid panel on Friday as well.  We gave you flu vaccine today.  Follow up date 3 month or as needed  25 minutes spent with patient. 50% of time spent counseling discussing diet and exercise. Advised on plans regarding if sugars are not controlled. Onglyza, injectable or possible endcorine referral.   Mackie Pai, PA-C

## 2017-11-26 NOTE — Patient Instructions (Signed)
For your diabetes will place future order a1c in and can get test done on Friday. Please get scheduled. Hopefully sugars are much better. Continue low sugar diet and exercise.  For high cholesterol will get cmp and lipid panel on Friday as well.  We gave you flu vaccine today.  Follow up date 3 month or as needed

## 2017-11-29 ENCOUNTER — Other Ambulatory Visit (INDEPENDENT_AMBULATORY_CARE_PROVIDER_SITE_OTHER): Payer: 59

## 2017-11-29 DIAGNOSIS — E118 Type 2 diabetes mellitus with unspecified complications: Secondary | ICD-10-CM

## 2017-11-29 DIAGNOSIS — E785 Hyperlipidemia, unspecified: Secondary | ICD-10-CM | POA: Diagnosis not present

## 2017-11-29 LAB — COMPREHENSIVE METABOLIC PANEL
ALBUMIN: 4.3 g/dL (ref 3.5–5.2)
ALT: 26 U/L (ref 0–53)
AST: 16 U/L (ref 0–37)
Alkaline Phosphatase: 45 U/L (ref 39–117)
BILIRUBIN TOTAL: 0.5 mg/dL (ref 0.2–1.2)
BUN: 19 mg/dL (ref 6–23)
CALCIUM: 9.4 mg/dL (ref 8.4–10.5)
CHLORIDE: 106 meq/L (ref 96–112)
CO2: 24 mEq/L (ref 19–32)
CREATININE: 0.93 mg/dL (ref 0.40–1.50)
GFR: 88.57 mL/min (ref >60.00)
Glucose, Bld: 149 mg/dL — ABNORMAL HIGH (ref 70–99)
Potassium: 4.1 mEq/L (ref 3.5–5.1)
SODIUM: 139 meq/L (ref 135–145)
Total Protein: 6.3 g/dL (ref 6.0–8.3)

## 2017-11-29 LAB — LIPID PANEL
CHOL/HDL RATIO: 3
Cholesterol: 110 mg/dL (ref 0–200)
HDL: 40.1 mg/dL (ref >39.00)
LDL CALC: 46 mg/dL (ref 0–99)
NonHDL: 70.12
Triglycerides: 122 mg/dL (ref 0.0–149.0)
VLDL: 24.4 mg/dL (ref 0.0–40.0)

## 2017-11-29 LAB — HEMOGLOBIN A1C: Hgb A1c MFr Bld: 7.1 % — ABNORMAL HIGH (ref 4.6–6.5)

## 2017-11-30 ENCOUNTER — Telehealth: Payer: Self-pay | Admitting: Medical

## 2017-11-30 MED ORDER — SAXAGLIPTIN HCL 2.5 MG PO TABS: 2.5000 mg | ORAL_TABLET | Freq: Every day | ORAL | 1 refills | Status: DC

## 2017-11-30 NOTE — Telephone Encounter (Signed)
rx onglyza sent to pt pharmacy.

## 2017-12-06 ENCOUNTER — Telehealth: Payer: Self-pay | Admitting: Medical

## 2017-12-06 NOTE — Telephone Encounter (Signed)
I did talk with patient and mentioned if sugars not ideally controlled could rx onglyza, injectable or refer him to endocrinologist(reviewed my note)t. Ideal control would be a1c 6.5. His was not that high but he had improved since prior a1c. I did result note and not sure if he has my chart.   Since a1c pretty close to goal only recommended onglyza. Injectable not necessary  If wife on hippa form talk with her. If not talk with pt.  At this point he can follow up with me next week and talk with him directly or I can just refer to endocrinologist if he wants.

## 2017-12-06 NOTE — Telephone Encounter (Signed)
Copied from CRM [phone]. Topic: General - Other >> Dec 06, 2017  8:16 AM Leafy Roobinson, Norma J wrote: Reason for CRM: pt wife terry is calling and she does not want any medication prescribed without provider having a conversation with her husband. Pt will not take onglyza due to side effects. Please cancelled any refill for this medication ongly[facility]

## 2018-02-18 ENCOUNTER — Other Ambulatory Visit: Payer: Self-pay | Admitting: Medical

## 2018-04-26 ENCOUNTER — Other Ambulatory Visit: Payer: Self-pay | Admitting: Medical

## 2018-04-28 ENCOUNTER — Telehealth: Payer: Self-pay | Admitting: Medical

## 2018-04-28 ENCOUNTER — Other Ambulatory Visit: Payer: Self-pay

## 2018-04-28 ENCOUNTER — Ambulatory Visit (INDEPENDENT_AMBULATORY_CARE_PROVIDER_SITE_OTHER): Payer: 59 | Admitting: Medical

## 2018-04-28 ENCOUNTER — Telehealth: Payer: Self-pay

## 2018-04-28 ENCOUNTER — Encounter: Payer: Self-pay | Admitting: Medical

## 2018-04-28 DIAGNOSIS — E118 Type 2 diabetes mellitus with unspecified complications: Secondary | ICD-10-CM

## 2018-04-28 DIAGNOSIS — E785 Hyperlipidemia, unspecified: Secondary | ICD-10-CM

## 2018-04-28 MED ORDER — SAXAGLIPTIN HCL 2.5 MG PO TABS: 2.5000 mg | ORAL_TABLET | Freq: Every day | ORAL | 0 refills | Status: DC

## 2018-04-28 MED ORDER — SITAGLIPTIN PHOSPHATE 25 MG PO TABS: 25.0000 mg | ORAL_TABLET | Freq: Every day | ORAL | 0 refills | Status: DC

## 2018-04-28 MED FILL — ONGLYZA 2.5 MG TABLET: 2.5 | 30 days supply | Qty: 30 | Fill #0

## 2018-04-28 NOTE — Telephone Encounter (Signed)
Notify pt onglyza preferred ahead of januvia. If onglyza too expensive then will try tradjenta.

## 2018-04-28 NOTE — Telephone Encounter (Signed)
Januvia not covered. Preferred: Gust Rung and Tradjenta

## 2018-04-28 NOTE — Patient Instructions (Addendum)
Patient does have a history of diabetes and was doing relatively well with just metformin.  Last A1c in November was 7.1.  Ihad hopes if I added low-dose Onglyza 2.5 mg that A1c would come down to 6.5 or less.  Unfortunately Onglyza was too expensive and patient had concerns for potential adverse side effects.  Discussion today about potential side effects and decided that we would try Januvia low-dose.  Patient will investigate his insurance formulary as to what is a better covered.  Might need to switch if Januvia is not well covered.  In addition patient has pharmacist friend that could counsel him further on potential risk/side effects of Januvia or Onglyza.  For high cholesterol, continue Crestor and fenofibrate.  I do want patient to check his blood pressure and pulse with machine he has at home.  Want to verify that his blood pressure is controlled/less than 130/80.  Patient is also going to check his blood sugars twice a day over the next week and send me update by my chart.  I did place future metabolic panel, lipid panel and A1c to be done hopefully in about 6 to 8 weeks when viral pandemic precautions are eased up some.  Follow-up date to be determined.

## 2018-04-28 NOTE — Progress Notes (Signed)
Subjective:    Patient ID: Justin Richard, male    DOB: Sep 03, 1960, 58 y.o.   MRN: [id number]  HPI  Virtual Visit via Video Note  I connected with Justin Richard on 04/28/18 at 10:00 AM EDT by a video enabled telemedicine application and verified that I am speaking with the correct person using two identifiers.   I discussed the limitations of evaluation and management by telemedicine and the availability of in person appointments. The patient expressed understanding and agreed to proceed.   History of Present Illness: Pt in for follow up. Pt states one reading post meal after meals. Most of his sugars are 140-149. Only time was his sugars was 269. Pt has not been checking every day. He was checking regularly but when saw good numbers started to check less frequently.  In the past patient's last A1c was 7.1.  At that time I wanted him to start low-dose Onglyza 2.5 mg.  Apparently the cost of that was around $200 despite him having insurance.  Also he was concerned about potential side effects.   He also has history of high cholesterol and is on both Crestor and fenofibrate.    Observations/Objective: No acute distress  Assessment and Plan: Patient does have a history of diabetes and was doing relatively well with just metformin.  Last A1c in November was 7.1.  I had hopes if I added low-dose Onglyza 2.5 mg that A1c would come down to 6.5 or less.  Unfortunately Onglyza was too expensive and patient had concerns for potential adverse side effects.  Discussion today about potential side effects and decided that we would try Januvia low-dose.  Patient will investigate his insurance formulary as to what is a better covered.  Might need to switch if Januvia is not well covered.  In addition patient has pharmacist friend that could counsel him further on potential risk/side effects of Januvia or Onglyza.  For high cholesterol, continue Crestor and fenofibrate.  I do want patient to check  his blood pressure and pulse with machine he has at home.  Want to verify that his blood pressure is controlled/less than 130/80.  Patient is also going to check his blood sugars twice a day over the next week and send me update by my chart.  I did place future metabolic panel, lipid panel and A1c to be done hopefully in about 6 to 8 weeks when viral pandemic precautions are eased up some.  Follow-up date to be determined.  Follow Up Instructions:    I discussed the assessment and treatment plan with the patient. The patient was provided an opportunity to ask questions and all were answered. The patient agreed with the plan and demonstrated an understanding of the instructions.   The patient was advised to call back or seek an in-person evaluation if the symptoms worsen or if the condition fails to improve as anticipated.  I provided 25 minutes of non-face-to-face time during this encounter.   Mackie Pai, PA-C    Review of Systems  Constitutional: Negative for chills, fatigue and fever.  HENT: Negative for congestion and ear discharge.   Respiratory: Negative for chest tightness, shortness of breath and wheezing.   Cardiovascular: Negative for chest pain and palpitations.  Endocrine: Negative for polydipsia, polyphagia and polyuria.  Neurological: Negative for dizziness and headaches.  Psychiatric/Behavioral: Negative for agitation and confusion.   Past Medical History:  Diagnosis Date  . Allergy   . Asthma   . Chicken pox   .  GERD (gastroesophageal reflux disease)   . Hyperlipidemia      Social History   Socioeconomic History  . Marital status: Married    Spouse name: Not on file  . Number of children: Not on file  . Years of education: Not on file  . Highest education level: Not on file  Occupational History  . Not on file  Social Needs  . Financial resource strain: Not on file  . Food insecurity:    Worry: Not on file    Inability: Not on file  .  Transportation needs:    Medical: Not on file    Non-medical: Not on file  Tobacco Use  . Smoking status: Never Smoker  . Smokeless tobacco: Never Used  Substance and Sexual Activity  . Alcohol use: No    Alcohol/week: 0.0 standard drinks  . Drug use: No  . Sexual activity: Not on file  Lifestyle  . Physical activity:    Days per week: Not on file    Minutes per session: Not on file  . Stress: Not on file  Relationships  . Social connections:    Talks on phone: Not on file    Gets together: Not on file    Attends religious service: Not on file    Active member of club or organization: Not on file    Attends meetings of clubs or organizations: Not on file    Relationship status: Not on file  . Intimate partner violence:    Fear of current or ex partner: Not on file    Emotionally abused: Not on file    Physically abused: Not on file    Forced sexual activity: Not on file  Other Topics Concern  . Not on file  Social History Narrative  . Not on file    Past Surgical History:  Procedure Laterality Date  . BRONCHOSCOPY      Family History  Problem Relation Age of Onset  . Arthritis Mother   . Cancer Mother   . Hyperlipidemia Mother   . Diabetes Mother   . Arthritis Father   . Cancer Father   . Hyperlipidemia Father   . Diabetes Father   . Cancer Maternal Grandmother   . Diabetes Maternal Grandmother   . Cancer Maternal Grandfather   . Diabetes Maternal Grandfather   . Cancer Paternal Grandmother   . Diabetes Paternal Grandmother   . Cancer Paternal Grandfather   . Diabetes Paternal Grandfather     Allergies  Allergen Reactions  . Codeine Nausea Only    Current Outpatient Medications on File Prior to Visit  Medication Sig Dispense Refill  . Blood Glucose Monitoring Suppl (ONE TOUCH ULTRA 2) w/Device KIT Use to check blood sugar 1 - 2 times daily 1 each 0  . fenofibrate 160 MG tablet TAKE 1 TABLET BY MOUTH  DAILY 90 tablet 3  . glucose blood (ONE TOUCH  ULTRA TEST) test strip Use as instructed to check blood sugar 1 - 2 times daily 180 each 1  . Lancets (ONETOUCH ULTRASOFT) lancets Use as instructed to check blood sugar 1 - 2 times daily 100 each 1  . metFORMIN (GLUCOPHAGE-XR) 500 MG 24 hr tablet 4 tab po q day 360 tablet 3  . metFORMIN (GLUCOPHAGE-XR) 500 MG 24 hr tablet Take 4 tablets by mouth daily 16 tablet 0  . rosuvastatin (CRESTOR) 20 MG tablet TAKE 1 TABLET BY MOUTH  DAILY 90 tablet 1  . saxagliptin HCl (ONGLYZA) 2.5 MG TABS  tablet Take 1 tablet (2.5 mg total) by mouth daily. 90 tablet 1   No current facility-administered medications on file prior to visit.     There were no vitals taken for this visit.      Objective:   Physical Exam No acute distress.      Assessment & Plan:

## 2018-04-28 NOTE — Telephone Encounter (Signed)
Will you update pt that onglyza or tradjenta preferred over [place]. I resent onglyza. If not covered well will try tradjenta.

## 2018-04-29 NOTE — Telephone Encounter (Signed)
Changed to Onglyza per PCP.

## 2018-05-07 ENCOUNTER — Encounter: Payer: Self-pay | Admitting: Medical

## 2018-05-08 ENCOUNTER — Telehealth: Payer: Self-pay | Admitting: Medical

## 2018-05-08 MED ORDER — LOSARTAN POTASSIUM 25 MG PO TABS: 25.0000 mg | ORAL_TABLET | Freq: Every day | ORAL | 2 refills | Status: DC

## 2018-05-08 NOTE — Telephone Encounter (Signed)
rx losartan sent to pt pharmacy. 

## 2018-07-06 ENCOUNTER — Other Ambulatory Visit: Payer: Self-pay | Admitting: Medical

## 2018-10-26 IMAGING — CR DG SHOULDER 2+V*R*
3 series · 3 of 3 positions shown · non-contrast
Comparison: Chest x-ray dated October 13, 2007 which included
portions of the right shoulder.

CLINICAL DATA: Two months of right shoulder pain especially
superiorly over the clavicle and deltoid region. No specific injury.

EXAM:
RIGHT SHOULDER - 2+ VIEW

[w shoulder grashey right]
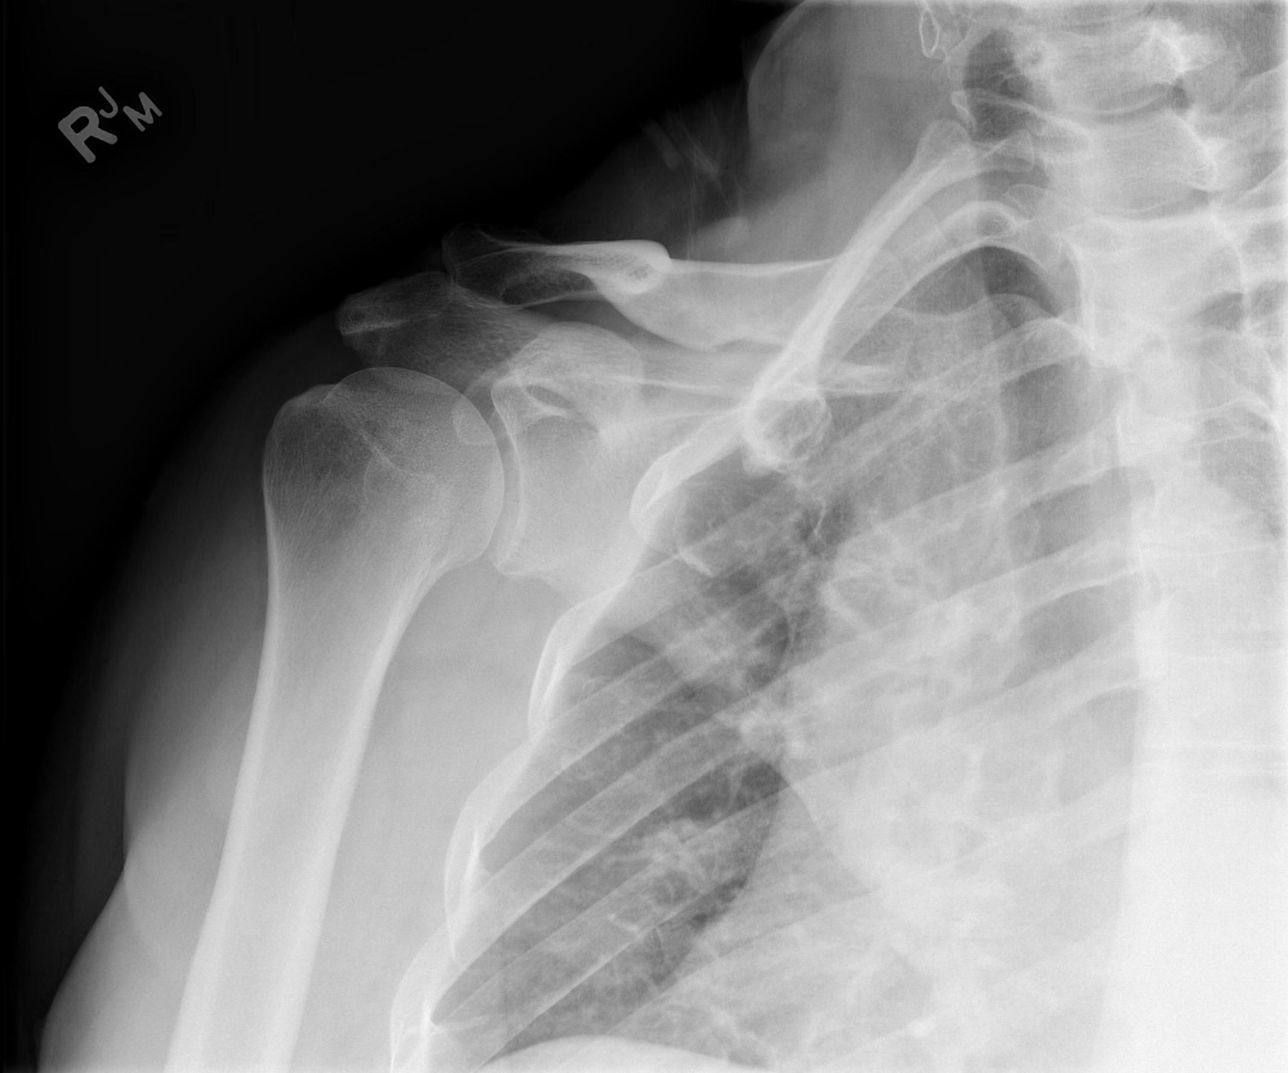

[w shoulder y view right *]
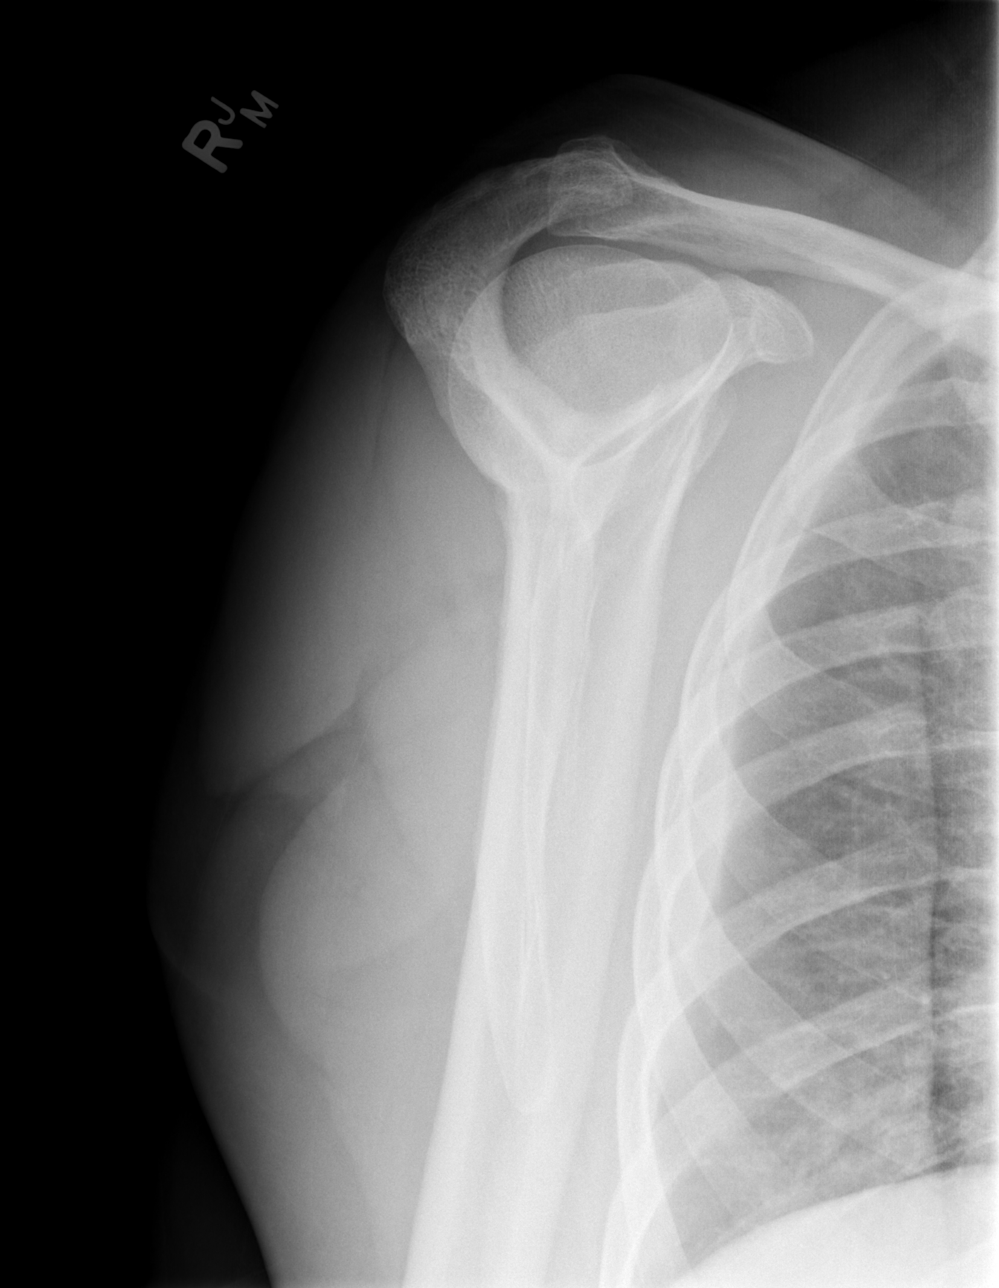

[w shoulder axillary right *]
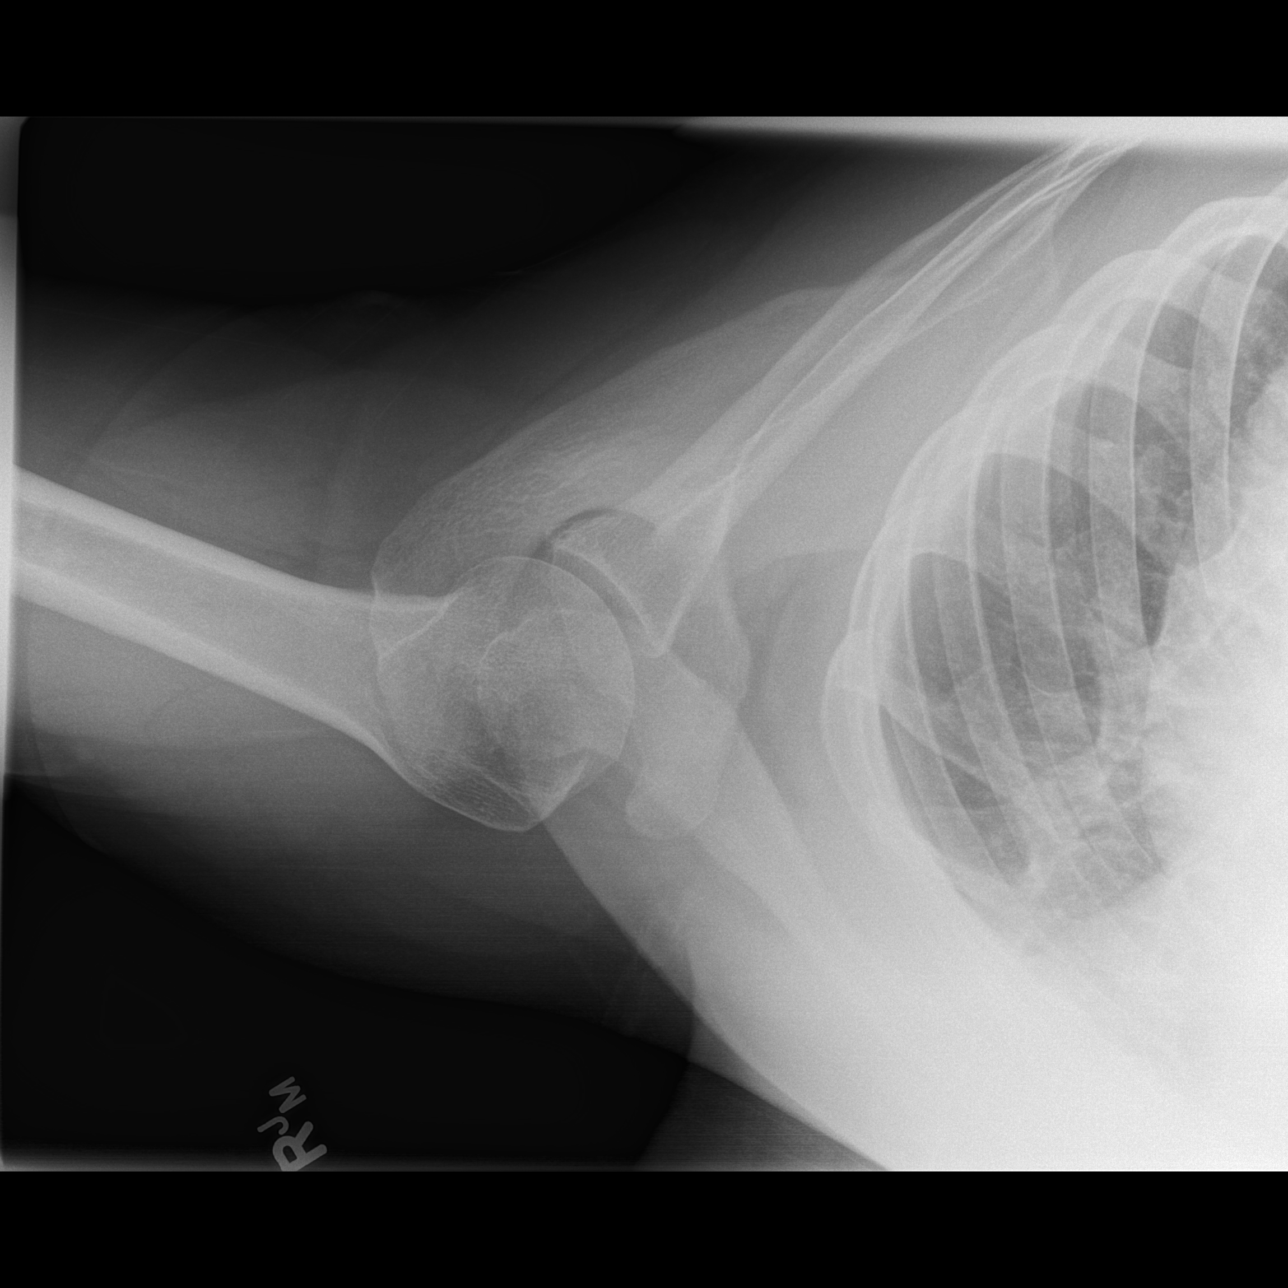

[3 of 3 positions shown; findings below may reference images not displayed]

FINDINGS: The bones are subjectively adequately mineralized. There is no acute
or healing fracture. The glenohumeral joint and the subacromial
subdeltoid space appear normal. There is mild narrowing of the
acromioclavicular joint. The overlying soft tissues are
unremarkable. The observed portions of the left clavicle and upper
left ribs are normal.
IMPRESSION: Mild degenerative narrowing of the AC joint. No acute bony
abnormality.

## 2019-02-02 ENCOUNTER — Ambulatory Visit: Payer: 59 | Attending: Internal Medicine

## 2019-02-02 DIAGNOSIS — Z20822 Contact with and (suspected) exposure to covid-19: Secondary | ICD-10-CM

## 2019-02-03 LAB — NOVEL CORONAVIRUS, NAA: SARS-CoV-2, NAA: NOT DETECTED

## 2020-09-27 DIAGNOSIS — E7800 Pure hypercholesterolemia, unspecified: Secondary | ICD-10-CM | POA: Diagnosis not present

## 2020-09-27 DIAGNOSIS — E1169 Type 2 diabetes mellitus with other specified complication: Secondary | ICD-10-CM | POA: Diagnosis not present

## 2020-09-27 DIAGNOSIS — Z23 Encounter for immunization: Secondary | ICD-10-CM | POA: Diagnosis not present

## 2020-09-27 DIAGNOSIS — I10 Essential (primary) hypertension: Secondary | ICD-10-CM | POA: Diagnosis not present

## 2020-09-27 DIAGNOSIS — R42 Dizziness and giddiness: Secondary | ICD-10-CM | POA: Diagnosis not present

## 2020-09-27 DIAGNOSIS — Z0000 Encounter for general adult medical examination without abnormal findings: Secondary | ICD-10-CM | POA: Diagnosis not present

## 2020-09-27 DIAGNOSIS — Z125 Encounter for screening for malignant neoplasm of prostate: Secondary | ICD-10-CM | POA: Diagnosis not present

## 2020-09-27 DIAGNOSIS — D691 Qualitative platelet defects: Secondary | ICD-10-CM | POA: Diagnosis not present

## 2021-03-31 DIAGNOSIS — I10 Essential (primary) hypertension: Secondary | ICD-10-CM | POA: Diagnosis not present

## 2021-03-31 DIAGNOSIS — E7800 Pure hypercholesterolemia, unspecified: Secondary | ICD-10-CM | POA: Diagnosis not present

## 2021-03-31 DIAGNOSIS — J309 Allergic rhinitis, unspecified: Secondary | ICD-10-CM | POA: Diagnosis not present

## 2021-03-31 DIAGNOSIS — E1169 Type 2 diabetes mellitus with other specified complication: Secondary | ICD-10-CM | POA: Diagnosis not present

## 2021-05-26 DIAGNOSIS — E1169 Type 2 diabetes mellitus with other specified complication: Secondary | ICD-10-CM | POA: Diagnosis not present

## 2021-05-26 DIAGNOSIS — E782 Mixed hyperlipidemia: Secondary | ICD-10-CM | POA: Diagnosis not present

## 2021-05-26 DIAGNOSIS — R0789 Other chest pain: Secondary | ICD-10-CM | POA: Diagnosis not present

## 2021-05-29 ENCOUNTER — Telehealth: Payer: Self-pay

## 2021-05-29 NOTE — Telephone Encounter (Signed)
NOTES SCANNED TO REFERRAL 

## 2021-05-31 NOTE — Progress Notes (Signed)
Cardiology Office Note:    Date:  05/31/2021   ID:  Justin Richard, DOB 12-Mar-1960, MRN [id number]  PCP:  Marisue [place]   [facility] Providers Cardiologist:  None     Referring MD: Esperanza Richters, PA-C   No chief complaint on file. Chest discomfort  History of Present Illness:    Justin Richard is a 61 y.o. male with a hx of asthma, GERD, T2DM, referral for chest pain  He notes intermittent symptoms over the past 6 months. He noted chest tightness with activity. In late April, he was doing moderate-medium exertion and noted significant shortness of breath and a dull chest discomfort for 36 hours. He's had intermittent chest discomfort since. He has esophageal spasms but he felt that this was different. He noted a low dull pain. For theses symptoms he was started on aspirin. He was also given nexium for possible GERD symptoms. He's on a statin. Calc LDL 42.EKG was sinus bradycardia with no ischemic changes.  He is a non smoker   Noted to have had cath 10-15 years ago. This was normal.  No records in the system.   Past Medical History:  Diagnosis Date   Allergy    Asthma    Chicken pox    GERD (gastroesophageal reflux disease)    Hyperlipidemia     Past Surgical History:  Procedure Laterality Date   BRONCHOSCOPY      Current Medications: No outpatient medications have been marked as taking for the 06/01/21 encounter (Appointment) with Maisie Fus, MD.     Allergies:   Codeine   Social History   Socioeconomic History   Marital status: Married    Spouse name: Not on file   Number of children: Not on file   Years of education: Not on file   Highest education level: Not on file  Occupational History   Not on file  Tobacco Use   Smoking status: Never   Smokeless tobacco: Never  Substance and Sexual Activity   Alcohol use: No    Alcohol/week: 0.0 standard drinks   Drug use: No   Sexual activity: Not on file  Other Topics Concern   Not on file   Social History Narrative   Not on file   Social Determinants of Health   Financial Resource Strain: Not on file  Food Insecurity: Not on file  Transportation Needs: Not on file  Physical Activity: Not on file  Stress: Not on file  Social Connections: Not on file     Family History: The patient's family history includes Arthritis in his father and mother; Cancer in his father, maternal grandfather, maternal grandmother, mother, paternal grandfather, and paternal grandmother; Diabetes in his father, maternal grandfather, maternal grandmother, mother, paternal grandfather, and paternal grandmother; Hyperlipidemia in his father and mother. Father CAD /CABG, CVA.   ROS:   Please see the history of present illness.     All other systems reviewed and are negative.  EKGs/Labs/Other Studies Reviewed:    The following studies were reviewed today:   EKG:  EKG is  ordered today.  The ekg ordered today demonstrates   06/01/2021- Sinus bradycardia HR 59 bpm no ischemic changes  Recent Labs: No results found for requested labs within last 8760 hours.  Recent Lipid Panel    Component Value Date/Time   CHOL 110 11/29/2017 0720   TRIG 122.0 11/29/2017 0720   HDL 40.10 11/29/2017 0720   CHOLHDL 3 11/29/2017 0720   VLDL 24.4 11/29/2017  0720   LDLCALC 46 11/29/2017 0720   LDLDIRECT 55.0 08/26/2017 0922     Risk Assessment/Calculations:           Physical Exam:    VS:  Vitals:   06/01/21 0833  BP: 124/66  Pulse: (!) 59  SpO2: 98%     Wt Readings from Last 3 Encounters:  11/26/17 242 lb (109.8 kg)  08/26/17 245 lb (111.1 kg)  11/28/16 249 lb 6.4 oz (113.1 kg)     GEN:  Well nourished, well developed in no acute distress HEENT: Normal NECK: No JVD; No carotid bruits LYMPHATICS: No lymphadenopathy CARDIAC: RRR, no murmurs, rubs, gallops RESPIRATORY:  Clear to auscultation without rales, wheezing or rhonchi  ABDOMEN: Soft, non-tender, non-distended MUSCULOSKELETAL:   No edema; No deformity  SKIN: Warm and dry NEUROLOGIC:  Alert and oriented x 3 PSYCHIATRIC:  Normal affect   ASSESSMENT:    Angina: CCS II: He reports symptoms of exertional chest discomfort. With intermediate CVD risk including age and DM2 , will plan for exercise MPI. He was counseled before if these symptoms recur to seek urgent medical attention. PLAN:    In order of problems listed above:  Exercise MPI at [address] Follow up as needed      Shared Decision Making/Informed Consent The risks [chest pain, shortness of breath, cardiac arrhythmias, dizziness, blood pressure fluctuations, myocardial infarction, stroke/transient ischemic attack, nausea, vomiting, allergic reaction, radiation exposure, metallic taste sensation and life-threatening complications (estimated to be 1 in 10,000)], benefits (risk stratification, diagnosing coronary artery disease, treatment guidance) and alternatives of a nuclear stress test were discussed in detail with Mr. Homann and he agrees to proceed.    Medication Adjustments/Labs and Tests Ordered: Current medicines are reviewed at length with the patient today.  Concerns regarding medicines are outlined above.  No orders of the defined types were placed in this encounter.  No orders of the defined types were placed in this encounter.   There are no Patient Instructions on file for this visit.   Signed, Maisie Fus, MD  05/31/2021 3:32 PM    [facility]

## 2021-06-01 ENCOUNTER — Encounter: Payer: Self-pay | Admitting: Internal Medicine

## 2021-06-01 ENCOUNTER — Ambulatory Visit (INDEPENDENT_AMBULATORY_CARE_PROVIDER_SITE_OTHER): Payer: BC Managed Care – PPO | Admitting: Internal Medicine

## 2021-06-01 VITALS — BP 124/66 | HR 59 | Ht 72.0 in | Wt 236.6 lb

## 2021-06-01 DIAGNOSIS — R079 Chest pain, unspecified: Secondary | ICD-10-CM

## 2021-06-01 NOTE — Patient Instructions (Signed)
Medication Instructions:  Your physician recommends that you continue on your current medications as directed. Please refer to the Current Medication list given to you today.  Testing/Procedures: Your physician has requested that you have an exercise stress myoview. For further information please visit [email or web address] Please follow instruction sheet, as given.  Follow-Up: At [facility], you and your health needs are our priority.  As part of our continuing mission to provide you with exceptional heart care, we have created designated Provider Care Teams.  These Care Teams include your primary Cardiologist (physician) and Advanced Practice Providers (APPs -  Physician Assistants and Nurse Practitioners) who all work together to provide you with the care you need, when you need it.  We recommend signing up for the patient portal called "MyChart".  Sign up information is provided on this After Visit Summary.  MyChart is used to connect with patients for Virtual Visits (Telemedicine).  Patients are able to view lab/test results, encounter notes, upcoming appointments, etc.  Non-urgent messages can be sent to your provider as well.   To learn more about what you can do with MyChart, go to [email or web address].    Your next appointment:   AS NEEDED with Dr. Wyline Mood    Important Information About Sugar

## 2021-06-05 ENCOUNTER — Telehealth (HOSPITAL_COMMUNITY): Payer: Self-pay | Admitting: *Deleted

## 2021-06-05 NOTE — Telephone Encounter (Signed)
Patient given detailed instructions per Myocardial Perfusion Study Information Sheet for the test on 06/06/21 Patient notified to arrive 15 minutes early and that it is imperative to arrive on time for appointment to keep from having the test rescheduled.  If you need to cancel or reschedule your appointment, please call the office within 24 hours of your appointment. . Patient verbalized understanding. Justin Richard   

## 2021-06-07 ENCOUNTER — Ambulatory Visit (HOSPITAL_COMMUNITY): Payer: BC Managed Care – PPO | Attending: Cardiology

## 2021-06-07 DIAGNOSIS — R079 Chest pain, unspecified: Secondary | ICD-10-CM | POA: Insufficient documentation

## 2021-06-07 LAB — MYOCARDIAL PERFUSION IMAGING
Estimated workload: 10.2
Exercise duration (min): 9 min
Exercise duration (sec): 7 s
LV dias vol: 93 mL (ref 62–150)
LV sys vol: 38 mL
MPHR: 159 {beats}/min
Nuc Stress EF: 59 %
Peak HR: 155 {beats}/min
Percent HR: 97 %
Rest HR: 55 {beats}/min
Rest Nuclear Isotope Dose: 10.1 mCi
SDS: 0
SRS: 0
SSS: 0
Stress Nuclear Isotope Dose: 31.6 mCi
TID: 0.97

## 2021-06-07 MED ORDER — REGADENOSON 0.4 MG/5ML IV SOLN: 0.4000 mg | Freq: Once | INTRAVENOUS | Status: AC

## 2021-06-07 MED ORDER — TECHNETIUM TC 99M TETROFOSMIN IV KIT
10.1000 | PACK | Freq: Once | INTRAVENOUS | Status: AC | PRN
  Administered 2021-06-07: 10.1 via INTRAVENOUS

## 2021-06-07 MED ORDER — TECHNETIUM TC 99M TETROFOSMIN IV KIT
31.6000 | PACK | Freq: Once | INTRAVENOUS | Status: AC | PRN
  Administered 2021-06-07: 31.6 via INTRAVENOUS

## 2021-10-06 DIAGNOSIS — L814 Other melanin hyperpigmentation: Secondary | ICD-10-CM | POA: Diagnosis not present

## 2021-10-06 DIAGNOSIS — D225 Melanocytic nevi of trunk: Secondary | ICD-10-CM | POA: Diagnosis not present

## 2021-10-06 DIAGNOSIS — L570 Actinic keratosis: Secondary | ICD-10-CM | POA: Diagnosis not present

## 2021-10-06 DIAGNOSIS — C44519 Basal cell carcinoma of skin of other part of trunk: Secondary | ICD-10-CM | POA: Diagnosis not present

## 2021-11-20 DIAGNOSIS — I10 Essential (primary) hypertension: Secondary | ICD-10-CM | POA: Diagnosis not present

## 2021-11-20 DIAGNOSIS — E7800 Pure hypercholesterolemia, unspecified: Secondary | ICD-10-CM | POA: Diagnosis not present

## 2021-11-20 DIAGNOSIS — E1169 Type 2 diabetes mellitus with other specified complication: Secondary | ICD-10-CM | POA: Diagnosis not present

## 2021-11-20 DIAGNOSIS — Z125 Encounter for screening for malignant neoplasm of prostate: Secondary | ICD-10-CM | POA: Diagnosis not present

## 2021-11-20 DIAGNOSIS — Z0000 Encounter for general adult medical examination without abnormal findings: Secondary | ICD-10-CM | POA: Diagnosis not present

## 2021-11-20 DIAGNOSIS — Z23 Encounter for immunization: Secondary | ICD-10-CM | POA: Diagnosis not present

## 2021-11-20 DIAGNOSIS — J309 Allergic rhinitis, unspecified: Secondary | ICD-10-CM | POA: Diagnosis not present

## 2021-12-01 DIAGNOSIS — Z23 Encounter for immunization: Secondary | ICD-10-CM | POA: Diagnosis not present

## 2021-12-27 DIAGNOSIS — Z03818 Encounter for observation for suspected exposure to other biological agents ruled out: Secondary | ICD-10-CM | POA: Diagnosis not present

## 2021-12-27 DIAGNOSIS — R52 Pain, unspecified: Secondary | ICD-10-CM | POA: Diagnosis not present

## 2021-12-27 DIAGNOSIS — B349 Viral infection, unspecified: Secondary | ICD-10-CM | POA: Diagnosis not present

## 2021-12-27 DIAGNOSIS — R0981 Nasal congestion: Secondary | ICD-10-CM | POA: Diagnosis not present

## 2021-12-29 DIAGNOSIS — E7800 Pure hypercholesterolemia, unspecified: Secondary | ICD-10-CM | POA: Diagnosis not present

## 2021-12-29 DIAGNOSIS — U071 COVID-19: Secondary | ICD-10-CM | POA: Diagnosis not present

## 2022-05-04 DIAGNOSIS — K649 Unspecified hemorrhoids: Secondary | ICD-10-CM | POA: Diagnosis not present

## 2022-05-04 DIAGNOSIS — K635 Polyp of colon: Secondary | ICD-10-CM | POA: Diagnosis not present

## 2022-05-04 DIAGNOSIS — D124 Benign neoplasm of descending colon: Secondary | ICD-10-CM | POA: Diagnosis not present

## 2022-05-04 DIAGNOSIS — Z1211 Encounter for screening for malignant neoplasm of colon: Secondary | ICD-10-CM | POA: Diagnosis not present

## 2022-06-07 DIAGNOSIS — J309 Allergic rhinitis, unspecified: Secondary | ICD-10-CM | POA: Diagnosis not present

## 2022-06-07 DIAGNOSIS — E7800 Pure hypercholesterolemia, unspecified: Secondary | ICD-10-CM | POA: Diagnosis not present

## 2022-06-07 DIAGNOSIS — E1169 Type 2 diabetes mellitus with other specified complication: Secondary | ICD-10-CM | POA: Diagnosis not present

## 2022-06-07 DIAGNOSIS — Z5181 Encounter for therapeutic drug level monitoring: Secondary | ICD-10-CM | POA: Diagnosis not present

## 2022-06-07 DIAGNOSIS — I10 Essential (primary) hypertension: Secondary | ICD-10-CM | POA: Diagnosis not present

## 2022-10-18 DIAGNOSIS — L570 Actinic keratosis: Secondary | ICD-10-CM | POA: Diagnosis not present

## 2022-10-18 DIAGNOSIS — Z85828 Personal history of other malignant neoplasm of skin: Secondary | ICD-10-CM | POA: Diagnosis not present

## 2022-10-18 DIAGNOSIS — D0462 Carcinoma in situ of skin of left upper limb, including shoulder: Secondary | ICD-10-CM | POA: Diagnosis not present

## 2022-10-18 DIAGNOSIS — L814 Other melanin hyperpigmentation: Secondary | ICD-10-CM | POA: Diagnosis not present

## 2022-10-18 DIAGNOSIS — D225 Melanocytic nevi of trunk: Secondary | ICD-10-CM | POA: Diagnosis not present

## 2022-12-04 DIAGNOSIS — Z125 Encounter for screening for malignant neoplasm of prostate: Secondary | ICD-10-CM | POA: Diagnosis not present

## 2022-12-04 DIAGNOSIS — Z23 Encounter for immunization: Secondary | ICD-10-CM | POA: Diagnosis not present

## 2022-12-04 DIAGNOSIS — Z0000 Encounter for general adult medical examination without abnormal findings: Secondary | ICD-10-CM | POA: Diagnosis not present

## 2022-12-04 DIAGNOSIS — E1169 Type 2 diabetes mellitus with other specified complication: Secondary | ICD-10-CM | POA: Diagnosis not present

## 2022-12-04 DIAGNOSIS — L723 Sebaceous cyst: Secondary | ICD-10-CM | POA: Diagnosis not present

## 2022-12-04 DIAGNOSIS — I10 Essential (primary) hypertension: Secondary | ICD-10-CM | POA: Diagnosis not present

## 2022-12-04 DIAGNOSIS — E7800 Pure hypercholesterolemia, unspecified: Secondary | ICD-10-CM | POA: Diagnosis not present

## 2023-06-07 DIAGNOSIS — J309 Allergic rhinitis, unspecified: Secondary | ICD-10-CM | POA: Diagnosis not present

## 2023-06-07 DIAGNOSIS — E7800 Pure hypercholesterolemia, unspecified: Secondary | ICD-10-CM | POA: Diagnosis not present

## 2023-06-07 DIAGNOSIS — I10 Essential (primary) hypertension: Secondary | ICD-10-CM | POA: Diagnosis not present

## 2023-06-07 DIAGNOSIS — E1169 Type 2 diabetes mellitus with other specified complication: Secondary | ICD-10-CM | POA: Diagnosis not present

## 2023-10-18 DIAGNOSIS — L814 Other melanin hyperpigmentation: Secondary | ICD-10-CM | POA: Diagnosis not present

## 2023-10-18 DIAGNOSIS — L821 Other seborrheic keratosis: Secondary | ICD-10-CM | POA: Diagnosis not present

## 2023-10-18 DIAGNOSIS — Z85828 Personal history of other malignant neoplasm of skin: Secondary | ICD-10-CM | POA: Diagnosis not present

## 2023-10-18 DIAGNOSIS — L570 Actinic keratosis: Secondary | ICD-10-CM | POA: Diagnosis not present

## 2023-11-27 DIAGNOSIS — H18603 Keratoconus, unspecified, bilateral: Secondary | ICD-10-CM | POA: Diagnosis not present

## 2023-11-27 DIAGNOSIS — H2513 Age-related nuclear cataract, bilateral: Secondary | ICD-10-CM | POA: Diagnosis not present
